# Patient Record
Sex: Female | Born: 1985 | Race: White | Hispanic: No | Marital: Married | Smoking: Current every day smoker
Health system: Southern US, Community
[De-identification: ages and names within clinical notes are randomized; demographics above are authoritative.]

## PROBLEM LIST (undated history)

## (undated) DIAGNOSIS — G43909 Migraine, unspecified, not intractable, without status migrainosus: Secondary | ICD-10-CM

## (undated) DIAGNOSIS — J45909 Unspecified asthma, uncomplicated: Secondary | ICD-10-CM

## (undated) DIAGNOSIS — O039 Complete or unspecified spontaneous abortion without complication: Secondary | ICD-10-CM

## (undated) DIAGNOSIS — F32A Depression, unspecified: Secondary | ICD-10-CM

## (undated) HISTORY — PX: TONSILLECTOMY: SUR1361

## (undated) HISTORY — PX: CYST REMOVAL TRUNK: SHX6283

---

## 2012-08-28 ENCOUNTER — Emergency Department: Payer: Self-pay | Admitting: Emergency Medicine

## 2013-03-03 ENCOUNTER — Emergency Department: Payer: Self-pay | Admitting: Unknown Physician Specialty

## 2013-03-03 LAB — URINALYSIS, COMPLETE
Bilirubin,UR: NEGATIVE
RBC,UR: 1 /HPF (ref 0–5)
WBC UR: 2 /HPF (ref 0–5)

## 2013-05-04 ENCOUNTER — Emergency Department: Payer: Self-pay | Admitting: Emergency Medicine

## 2013-05-08 ENCOUNTER — Encounter (HOSPITAL_COMMUNITY): Payer: Self-pay | Admitting: Emergency Medicine

## 2013-05-08 ENCOUNTER — Emergency Department (HOSPITAL_COMMUNITY): Payer: Self-pay

## 2013-05-08 ENCOUNTER — Emergency Department (HOSPITAL_COMMUNITY)
Admission: EM | Admit: 2013-05-08 | Discharge: 2013-05-08 | Disposition: A | Discharge status: Home / Self Care | Payer: Self-pay | Attending: Emergency Medicine | Admitting: Emergency Medicine

## 2013-05-08 DIAGNOSIS — J45909 Unspecified asthma, uncomplicated: Secondary | ICD-10-CM | POA: Insufficient documentation

## 2013-05-08 DIAGNOSIS — G43909 Migraine, unspecified, not intractable, without status migrainosus: Secondary | ICD-10-CM

## 2013-05-08 DIAGNOSIS — R209 Unspecified disturbances of skin sensation: Secondary | ICD-10-CM | POA: Insufficient documentation

## 2013-05-08 DIAGNOSIS — Z3202 Encounter for pregnancy test, result negative: Secondary | ICD-10-CM | POA: Insufficient documentation

## 2013-05-08 HISTORY — DX: Migraine, unspecified, not intractable, without status migrainosus: G43.909

## 2013-05-08 HISTORY — DX: Unspecified asthma, uncomplicated: J45.909

## 2013-05-08 LAB — RAPID URINE DRUG SCREEN, HOSP PERFORMED
Amphetamines: NOT DETECTED
Cocaine: NOT DETECTED
Opiates: NOT DETECTED

## 2013-05-08 LAB — APTT: aPTT: 27 seconds (ref 24–37)

## 2013-05-08 LAB — DIFFERENTIAL
Basophils Absolute: 0 10*3/uL (ref 0.0–0.1)
Basophils Relative: 0 % (ref 0–1)
Eosinophils Absolute: 0.2 10*3/uL (ref 0.0–0.7)
Monocytes Relative: 7 % (ref 3–12)
Neutro Abs: 5.2 10*3/uL (ref 1.7–7.7)
Neutrophils Relative %: 54 % (ref 43–77)

## 2013-05-08 LAB — TROPONIN I: Troponin I: <0.3 ng/mL (ref <0.30)

## 2013-05-08 LAB — CBC
Hemoglobin: 13.5 g/dL (ref 12.0–15.0)
MCH: 29.3 pg (ref 26.0–34.0)
MCHC: 34.7 g/dL (ref 30.0–36.0)
Platelets: 208 10*3/uL (ref 150–400)
RDW: 13.3 % (ref 11.5–15.5)

## 2013-05-08 LAB — PROTIME-INR
INR: 0.9 (ref 0.00–1.49)
Prothrombin Time: 12.1 seconds (ref 11.6–15.2)

## 2013-05-08 LAB — COMPREHENSIVE METABOLIC PANEL
AST: 19 U/L (ref 0–37)
Albumin: 4.1 g/dL (ref 3.5–5.2)
Alkaline Phosphatase: 75 U/L (ref 39–117)
BUN: 19 mg/dL (ref 6–23)
Chloride: 102 mEq/L (ref 96–112)
Potassium: 3.2 mEq/L — ABNORMAL LOW (ref 3.5–5.1)
Total Protein: 7.2 g/dL (ref 6.0–8.3)

## 2013-05-08 LAB — POCT PREGNANCY, URINE: Preg Test, Ur: NEGATIVE

## 2013-05-08 LAB — POCT I-STAT TROPONIN I: Troponin i, poc: 0.01 ng/mL (ref 0.00–0.08)

## 2013-05-08 LAB — GLUCOSE, CAPILLARY: Glucose-Capillary: 106 mg/dL — ABNORMAL HIGH (ref 70–99)

## 2013-05-08 MED ORDER — ONDANSETRON 4 MG PO TBDP: 8.0000 mg | ORAL_TABLET | Freq: Once | ORAL | Status: DC

## 2013-05-08 MED ORDER — DEXAMETHASONE SODIUM PHOSPHATE 10 MG/ML IJ SOLN
10.0000 mg | Freq: Once | INTRAMUSCULAR | Status: AC
  Administered 2013-05-08: 10 mg via INTRAVENOUS
  Filled 2013-05-08: qty 1

## 2013-05-08 MED ORDER — PROCHLORPERAZINE EDISYLATE 5 MG/ML IJ SOLN
10.0000 mg | Freq: Once | INTRAMUSCULAR | Status: AC
  Administered 2013-05-08: 10 mg via INTRAVENOUS
  Filled 2013-05-08: qty 2

## 2013-05-08 MED ORDER — METOCLOPRAMIDE HCL 5 MG/ML IJ SOLN
10.0000 mg | Freq: Once | INTRAMUSCULAR | Status: AC
  Administered 2013-05-08: 10 mg via INTRAVENOUS
  Filled 2013-05-08 (×2): qty 2

## 2013-05-08 MED ORDER — KETOROLAC TROMETHAMINE 15 MG/ML IJ SOLN
15.0000 mg | Freq: Once | INTRAMUSCULAR | Status: AC
  Administered 2013-05-08: 15 mg via INTRAVENOUS
  Filled 2013-05-08: qty 1

## 2013-05-08 MED ORDER — POTASSIUM CHLORIDE CRYS ER 20 MEQ PO TBCR
40.0000 meq | EXTENDED_RELEASE_TABLET | Freq: Once | ORAL | Status: AC
  Administered 2013-05-08: 40 meq via ORAL
  Filled 2013-05-08: qty 2

## 2013-05-08 MED ORDER — LORAZEPAM 1 MG PO TABS
1.0000 mg | ORAL_TABLET | Freq: Once | ORAL | Status: AC
  Administered 2013-05-08: 1 mg via ORAL
  Filled 2013-05-08: qty 1

## 2013-05-08 NOTE — ED Notes (Signed)
Pt vomited filling half of an emesis bag. MD informed.

## 2013-05-08 NOTE — ED Provider Notes (Signed)
History     CSN: [id number]  Arrival date & time 05/08/13  1224   First MD Initiated Contact with Patient 05/08/13 1232      Chief Complaint  Patient presents with  . Code Stroke     Patient is a 27 y.o. female presenting with migraines.  Migraine This is a new problem. The current episode started today. The problem occurs constantly. The problem has been unchanged. Associated symptoms include headaches and numbness (Numbness started on the rigth side of her face and moved to the left side of her face.  she also has numbness in her bilateral feet). Pertinent negatives include no abdominal pain, chest pain, chills, congestion, coughing, diaphoresis, fever, nausea, neck pain, rash, sore throat, vertigo, visual change, vomiting or weakness. Nothing aggravates the symptoms. She has tried nothing for the symptoms.   She states that she was first diagnosed with migraines last week.  Has been having frequent, similar headaches for some time now, usually unilateral.  Headache was gradual in onset.  Associated with significant anxiety.  Patient noted to be hyperventilating and very anxious appearing at presentation.  She states she has been awake for 36 hours straight.  Currently with pain only to her head and left face; denies chest pain, neck pain, neck stiffness, fever, chills, abdominal pain, nausea, vomiting, vaginal bleeding, leg pain, or other significant symptoms.  Only partially cooperative with neuro exam by stroke team; states she just wants to call her wife.   Past Medical History  Diagnosis Date  . Migraines   . Asthma     History reviewed. No pertinent past surgical history.  No family history on file.  History  Substance Use Topics  . Smoking status: Not on file  . Smokeless tobacco: Not on file  . Alcohol Use: Not on file    OB History   Grav Para Term Preterm Abortions TAB SAB Ect Mult Living                  Review of Systems  Constitutional: Negative for fever,  chills and diaphoresis.  HENT: Negative for congestion, sore throat, rhinorrhea, neck pain and neck stiffness.   Respiratory: Negative for cough, shortness of breath and wheezing.   Cardiovascular: Negative for chest pain and leg swelling.  Gastrointestinal: Negative for nausea, vomiting, abdominal pain and diarrhea.  Genitourinary: Negative for dysuria, urgency, frequency, flank pain, vaginal bleeding, vaginal discharge and difficulty urinating.  Skin: Negative for rash.  Neurological: Positive for numbness (Numbness started on the rigth side of her face and moved to the left side of her face.  she also has numbness in her bilateral feet) and headaches. Negative for vertigo and weakness.  All other systems reviewed and are negative.    Allergies  Other  Home Medications   Current Outpatient Rx  Name  Route  Sig  Dispense  Refill  . albuterol (PROVENTIL HFA;VENTOLIN HFA) 108 (90 BASE) MCG/ACT inhaler   Inhalation   Inhale 2 puffs into the lungs every 6 (six) hours as needed for wheezing.           BP 104/67  Pulse 101  Temp(Src) 98 F (36.7 C)  Resp 18  SpO2 98%  LMP 04/17/2013  Physical Exam  Nursing note and vitals reviewed. Constitutional: She is oriented to person, place, and time. She appears well-developed and well-nourished. No distress.  Anxious, hyperventilating, semi-cooperative with neuro exam, alert, oriented X 4  HENT:  Head: Normocephalic and atraumatic.  Mouth/Throat: Oropharynx  is clear and moist.  Eyes: Conjunctivae and EOM are normal. Pupils are equal, round, and reactive to light. No scleral icterus.  Neck: Normal range of motion. Neck supple. No JVD present.  Cardiovascular: Normal rate, regular rhythm, normal heart sounds and intact distal pulses.  Exam reveals no gallop and no friction rub.   No murmur heard. Pulmonary/Chest: Effort normal and breath sounds normal. No respiratory distress. She has no wheezes. She has no rales.  Abdominal: Soft.  Bowel sounds are normal. She exhibits no distension. There is no tenderness. There is no rebound and no guarding.  Musculoskeletal: She exhibits no edema.  Neurological: She is alert and oriented to person, place, and time. No cranial nerve deficit or sensory deficit. She exhibits normal muscle tone. She displays no seizure activity. Coordination normal. GCS eye subscore is 4. GCS verbal subscore is 5. GCS motor subscore is 6. She displays no Babinski's sign on the right side. She displays no Babinski's sign on the left side.  Normal finger to nose and heel to shin.  Visual fields intact to confrontation.    Skin: Skin is warm and dry. She is not diaphoretic.    ED Course  Procedures (including critical care time)  Labs Reviewed  COMPREHENSIVE METABOLIC PANEL - Abnormal; Notable for the following:    Potassium 3.2 (*)    Glucose, Bld 110 (*)    All other components within normal limits  GLUCOSE, CAPILLARY - Abnormal; Notable for the following:    Glucose-Capillary 106 (*)    All other components within normal limits  URINE RAPID DRUG SCREEN (HOSP PERFORMED) - Abnormal; Notable for the following:    Tetrahydrocannabinol POSITIVE (*)    Barbiturates POSITIVE (*)    All other components within normal limits  PROTIME-INR  APTT  CBC  DIFFERENTIAL  TROPONIN I  POCT I-STAT TROPONIN I  POCT PREGNANCY, URINE   Dg Chest 2 View  05/08/2013   *RADIOLOGY REPORT*  Clinical Data: Shortness of breath, anxiety, history migraines, asthma  CHEST - 2 VIEW  Comparison: None  Findings: Upper normal heart size. Mediastinal contours and pulmonary vascularity normal. Low lung volumes with bibasilar atelectasis. No definite infiltrate, pleural effusion or pneumothorax. No acute osseous findings.  IMPRESSION: Bibasilar atelectasis.   Original Report Authenticated By: Ulyses Southward, M.D.   Ct Head (brain) Wo Contrast  05/08/2013   *RADIOLOGY REPORT*  Clinical Data: Code stroke.  Facial numbness.  Right side of  body numbness.  CT HEAD WITHOUT CONTRAST  Technique:  Contiguous axial images were obtained from the base of the skull through the vertex without contrast.  Comparison: None  Findings: The brain has a normal appearance without evidence for hemorrhage, infarction, hydrocephalus, or mass lesion.  There is no extra axial fluid collection.  The skull and paranasal sinuses are normal.  IMPRESSION: Normal exam.   Original Report Authenticated By: Signa Kell, M.D.     1. Migraine       MDM  27 yo F with h/o migraines presents as code stroke due to EMS c/f slurred speech.  She c/o unilateral headache and face numbness.  No other neuro complaints.  Normal vitals.  Very anxious appearing.  Hyperventilating.  Non-focal neuro exam.  No meningismus.  Exam otherwise unremarkable.    CT negative.  Evaluated and cleared by neurology.  Doubt SAH, CNS infection, cerebral venous sinus thrombosis, or other emergent cause.  Likely complex migraine.  Treated with migraine cocktail and ativan.  Complete resolution of sx.  Discharged home with return precautions.        Toney Sang, MD 05/08/13 2153

## 2013-05-08 NOTE — Code Documentation (Signed)
27 year old female who presented to [facility] as code stroke.  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 EMS was called and code stroke was called from the field at 1213 with ETA 10 mins.  Patient arrived to ED at 1224.  EDP exam at 1224.  Stroke team arrival at 1224.  To CT scan at 1226. Witnessed sx started at 1135 so LSW 1135.  Patient inconsistent with exam - became more inconsistent as she became more anxious with time.  She first stated left side numbness c/o then with increased anxiety stated it was entire body.  She does endorse headache with some photophobia.  States she was diagnosed last week with migraines but no meds prescribed.  No focal deficit noted.  NIHSS 0.  ST when crying with anxiety - calling for [place] to get her family but cannot recall last names or phone numbers.  When family arrives she calms with their presence and medications for headache and nausea and anxiety.  Code stroke cancelled per Dr. Cyril Mourning.  Suspect migraine.  Handoff to [facility] RN and Musician - to call if sx return.

## 2013-05-08 NOTE — Discharge Instructions (Signed)
Migraine Headache A migraine headache is very bad, throbbing pain on one or both sides of your head. Talk to your doctor about what things may bring on (trigger) your migraine headaches. HOME CARE  Only take medicines as told by your doctor.  Lie down in a dark, quiet room when you have a migraine.  Keep a journal to find out if certain things bring on migraine headaches. For example, write down:  What you eat and drink.  How much sleep you get.  Any change to your diet or medicines.  Lessen how much alcohol you drink.  Quit smoking if you smoke.  Get enough sleep.  Lessen any stress in your life.  Keep lights dim if bright lights bother you or make your migraines worse. GET HELP RIGHT AWAY IF:   Your migraine becomes really bad.  You have a fever.  You have a stiff neck.  You have trouble seeing.  Your muscles are weak, or you lose muscle control.  You lose your balance or have trouble walking.  You feel like you will pass out (faint), or you pass out.  You have really bad symptoms that are different than your first symptoms. MAKE SURE YOU:   Understand these instructions.  Will watch your condition.  Will get help right away if you are not doing well or get worse. Document Released: 09/12/2008 Document Revised: 02/26/2012 Document Reviewed: 11/24/2011 [facility] Patient Information 2014 [place], [place].   RESOURCE GUIDE  Chronic Pain Problems: Contact [facility]  [phone] Patients need to be referred by their primary care doctor.  Insufficient Money for Medicine: Contact United Way:  call "211."   No Primary Care Doctor: - Call Health Connect  [phone] - can help you locate a primary care doctor that  accepts your insurance, provides certain services, etc. - Physician Referral Service- [phone]  Agencies that provide inexpensive medical care: - [facility]  [phone] - [facility]   [phone] - [facility]  [phone] - Women's Clinic  [phone] - Planned Parenthood  [phone] [facility]  [phone]  Medicaid-accepting [facility] Providers: - [facility]- [address]  [phone], Mon-Fri 9am-7pm, Sat 9am-1pm - [facility]- [address] [place], Suite [place]  [phone] - [facility]- [address], [address] [place]  [phone] [facility]- [address]  [phone] - Renaye Rakers- [address] [place], [address], [phone]  Only accepts [place] patients after they have their name  applied to their card  Self Pay (no insurance) in [place]: - Sickle Cell Patients - [facility]  [address] [place], [phone] - [facility]- [address] [place]  [phone]       [facility] [place]- [address]       -     [facility]- see information above (Speak to [employer] if you do not have insurance)       -  [facility]- [address],  [phone]       -  [facility]- [address], [phone]       -  Dr Julio Sicks-  [address], [place], [phone]       -  [facility] - [address], [phone]       -  [facility]- [address], [phone], also [address] [phone]       -     [facility]- [address] [place], [phone], 1st & 3rd Saturday         every month, 10am-1pm  -     [facility]   [address] [place], [place].   Phone:  [phone], Fax:  [phone]. Hours of Operation:  9 am - 6 pm, M-F.  -     [facility]   [address], [place]   Phone: [phone], Fax: [phone]. Hours of Operation:  8:30 am - 5:30 pm, M-F.  [facility] [address] [place], [place]  [address] [phone]  [facility] [address] [place], [place] [address] [phone]  1) Find a Doctor and Pay Out of Pocket Although you won't have to find out who is covered by your insurance plan, it is a good idea to ask around and get recommendations. You will then need to call the office and see if the doctor you have chosen will accept you as a new patient and what types of options they offer for patients who are self-pay. Some doctors offer discounts or will set up payment plans for their patients who do not have insurance, but you will need to ask so you aren't surprised when you get to your appointment.  2) Contact Your Local Health Department Not all health departments have doctors that can see patients for sick visits, but many do, so it is worth a call to see if yours does. If you don't know where your local health department is, you can check in your phone book. The CDC also has a tool to help you locate your state's health department, and many state websites also have listings of all of their local health departments.  3) Find a Walk-in Clinic If your illness is not likely to be very severe or complicated, you may want to try a walk in clinic. These are popping up all over the country in pharmacies, drugstores, and shopping centers. They're usually staffed by nurse practitioners or physician assistants that have been trained to treat common illnesses and complaints. They're usually fairly quick and inexpensive. However, if you have serious medical issues or chronic medical problems, these are probably not your best option  STD Testing - [facility] Department of [facility] - [facility], STD Clinic, [address] [place], phone [phone] or [phone].  Monday - Friday, call for an appointment. [facility] of [employer], STD Clinic, [address], [place], phone [phone] or [phone].  Monday - Friday, call for an  appointment.  Abuse/Neglect: [facility] Child Abuse Hotline 806-647-2488 [facility] Child Abuse Hotline 440-591-4198 (After Hours)  Emergency Shelter:  [facility] [phone]  Maternity Homes: - Room at the [place] [facility] [phone] - [facility] [phone]  MRSA Hotline #:   (463) 688-4223  Dental Assistance If unable to pay or uninsured, contact:  [facility]. to become qualified for the adult dental clinic.  Patients with Medicaid: [facility] [phone], [phone] [address] [phone]  If unable to pay, or uninsured, contact [facility] [phone] in [place], [phone]  in [facility]) to become qualified for the adult dental clinic  [facility] [address] [place], [place] [address] [phone] [email or web address]  Other Proofreader Services: - [facility]- [address] [place], [place], [place], [address], [phone], Ext. 123, 2nd and 4th Thursday of the month at 6:30am.  10 clients each day by appointment, can sometimes see walk-in patients if someone does not show for an appointment. [facility]- [address] [place], [place], [address], [phone] - [facility] [address], [place], [place], [address], [phone] - [facility]- [phone] [facility] Health Department- [phone] [facility] Health [phone]       Behavioral Health Resources in the [facility]  Intensive Outpatient Programs: [facility]      [address] [place], [place] [phone] Both a day and evening program       [facility] Outpatient     [address]        [place], [place] [address] [phone]         ADS: Alcohol & Drug Svcs [address] [place] [phone]  [facility] Mental  Health ACCESS LINE: 705-567-4429 or [phone] [address] [place], [place] [address] [email or web address]   Substance Abuse Resources: - Alcohol and Drug Services  (757)723-7968 - [facility] [phone] - [facility] [place] [phone] Floydene Flock [phone] - Residential & Outpatient Substance Abuse Program  ([phone])[phone]  Psychological Services: [facility]  [phone] [facility]  [phone] - [facility], [phone] [place]. [address] [place], ACCESS LINE: (573)815-5466 or [phone], [email or web address]  Mobile Crisis Teams:                                        Therapeutic Alternatives         Mobile Crisis Care Unit [phone]             [facility] [address] Ginette Otto [phone]                                         Interventionist [address] [place] [phone]  Self-Help/Support Groups: Mental Health Assoc. of [employer] of support groups 440-403-2733 (call for more info)  Narcotics Anonymous (NA) Caring Services [address] [place] - 2 meetings at this location  Residential Treatment Programs:  [facility]      [address]        [place]       [phone]         [facility] [address] [place] [address]378 [place], [place]  [address] [phone]  [facility] Treatment Facility  [address] [place], [place] [address] [phone] Admissions: 8am-3pm M-F  [facility]     [address]        [place], [place] [address]       [phone]         [facility] [address] [place]ling Manns [place], [place] [phone]  [facility] [address] [place], [place] [phone]  [facility] - Intensive Outpatient      [address]     [place], [place]       [phone]         [facility] (Addiction Recovery [facility]     [address] [place], [place] [phone] or [phone]  Residential Treatment Services (RTS), Medicaid [address] [place], [place] 629-528-4132  Fellowship [address]                                               [address] [place] [phone]  [facility] Resources: [phone]               General Therapy                                                Angie Fava, PhD        [address] [place], [place] [address]         [phone]   Insurance  [facility] Behavioral   [address] [place], [place] [address] [phone]  [facility] [address] [place], [place] [address] [phone] Insurance/Medicaid/sponsorship through [facility] and Families                                              [address]                                        [place], [place] [address]    Therapy/tele-psych/case         [phone]          [facility] [address], [place]  [address]  Adolescent/group home/case management [phone]                                           Creola Corn PhD       General therapy       Insurance   [phone]         Dr. Lolly Mustache, [place], M-F [phone]  [facility] [place]  [employer] [facility] Dept. [address]                 [address]         [address] [place] [address]  [address]  [facility] Phone:  [phone]                                  Phone:  [phone]                   Phone:  [phone]  [facility], [phone] - [facility] - [facility]- [phone]       -     [facility] in [place], [address]              [phone], [facility] Child Abuse Hotline 920-309-3511 or 531-296-5980 (After Hours)

## 2013-05-08 NOTE — ED Notes (Signed)
Per EMS pt states she started experiencing numbness and tingling on the left side of her face. Per EMS pt states the numbness and tingling started on the right side and moved to the left side.

## 2013-05-08 NOTE — Consult Note (Signed)
Referring Physician: Fredderick Phenix    Chief Complaint: left facial numbness and facial droop  HPI:                                                                                                                                         Diane Powell is an 27 y.o. female who was diagnosed with migraine HA one week ago but takes no abortive medications at this time.  The only other PMHx is Asthma. Patient was at work today when she noted a HA with associated symptoms of left facial numbness and photophobia. Per patient her boss also notice a left facial droop.  Patient initially stated she only had left facial numbness but later stated it was left face, arm and leg.  The history is inconstant, as further into her examination she stated it was actually bilateral arms and legs "whole body numbness".  She was brought to [facility] as a code stroke.  On initial exam patient showed no focal signs but did have subjective tingling through out her body. She is very anxious during exam. When asked if she has history of anxiety she denies this. She would state she felt short of breath during exam but O2 saturation was 100%.  Patient continued to ask for her wife and had a difficult time concentrating on answering questions.   Date last known well: 5.2214 Time last known well: 1200 tPA Given: No: resolution of symptoms  Past Medical History  Diagnosis Date  . Migraines   . Asthma     History reviewed. No pertinent past surgical history.  No family history on file. Social History:  has no tobacco, alcohol, and drug history on file.  Allergies: Allergies not on file  Medications:                                                                                                                           No current facility-administered medications for this encounter.   No current outpatient prescriptions on file.    ROS:  History obtained from the patient  General ROS: negative for - chills, fatigue, fever, night sweats, weight gain or weight loss Psychological ROS: negative for - behavioral disorder, hallucinations, memory difficulties, mood swings or suicidal ideation Ophthalmic ROS: negative for - blurry vision, double vision, eye pain or loss of vision ENT ROS: negative for - epistaxis, nasal discharge, oral lesions, sore throat, tinnitus or vertigo Allergy and Immunology ROS: negative for - hives or itchy/watery eyes Hematological and Lymphatic ROS: negative for - bleeding problems, bruising or swollen lymph nodes Endocrine ROS: negative for - galactorrhea, hair pattern changes, polydipsia/polyuria or temperature intolerance Respiratory ROS: negative for - cough, hemoptysis, shortness of breath or wheezing Cardiovascular ROS: negative for - chest pain, dyspnea on exertion, edema or irregular heartbeat Gastrointestinal ROS: negative for - abdominal pain, diarrhea, hematemesis, nausea/vomiting or stool incontinence Genito-Urinary ROS: negative for - dysuria, hematuria, incontinence or urinary frequency/urgency Musculoskeletal ROS: negative for - joint swelling or muscular weakness Neurological ROS: as noted in HPI Dermatological ROS: negative for rash and skin lesion changes  Neurologic Examination:                                                                                                      Blood pressure 127/68, pulse 105, temperature 98 F (36.7 C), resp. rate 18, last menstrual period 04/17/2013, SpO2 100.00%.   Mental Status: Alert, oriented, very anxious.  Not consist ant when answering questions due to anxiety.  Speech fluent without evidence of aphasia.  Able to follow 3 step commands without difficulty. Cranial Nerves: II: Discs flat bilaterally; Visual fields grossly normal, pupils equal, round, reactive to light and accommodation III,IV, VI: ptosis  not present, extra-ocular motions intact bilaterally V,VII: smile symmetric, facial light touch sensation normal bilaterally VIII: hearing normal bilaterally IX,X: gag reflex present XI: bilateral shoulder shrug XII: midline tongue extension Motor: Right : Upper extremity   5/5    Left:     Upper extremity   5/5  Lower extremity   5/5     Lower extremity   5/5 Tone and bulk:normal tone throughout; no atrophy noted Sensory: Pinprick and light touch intact throughout, bilaterally Deep Tendon Reflexes: 2+ and symmetric throughout Plantars: Right: downgoing   Left: downgoing Cerebellar: normal finger-to-nose,  normal heel-to-shin test CV: pulses palpable throughout    Results for orders placed during the hospital encounter of 05/08/13 (from the past 48 hour(s))  PROTIME-INR     Status: None   Collection Time    05/08/13 12:40 PM      Result Value Range   Prothrombin Time 12.1  11.6 - 15.2 seconds   INR 0.90  0.00 - 1.49  APTT     Status: None   Collection Time    05/08/13 12:40 PM      Result Value Range   aPTT 27  24 - 37 seconds  CBC     Status: None   Collection Time    05/08/13 12:40 PM      Result Value Range   WBC 9.7  4.0 - 10.5 K/uL  RBC 4.60  3.87 - 5.11 MIL/uL   Hemoglobin 13.5  12.0 - 15.0 g/dL   HCT 16.1  09.6 - 04.5 %   MCV 84.6  78.0 - 100.0 fL   MCH 29.3  26.0 - 34.0 pg   MCHC 34.7  30.0 - 36.0 g/dL   RDW 40.9  81.1 - 91.4 %   Platelets 208  150 - 400 K/uL  DIFFERENTIAL     Status: None   Collection Time    05/08/13 12:40 PM      Result Value Range   Neutrophils Relative % 54  43 - 77 %   Neutro Abs 5.2  1.7 - 7.7 K/uL   Lymphocytes Relative 37  12 - 46 %   Lymphs Abs 3.5  0.7 - 4.0 K/uL   Monocytes Relative 7  3 - 12 %   Monocytes Absolute 0.7  0.1 - 1.0 K/uL   Eosinophils Relative 2  0 - 5 %   Eosinophils Absolute 0.2  0.0 - 0.7 K/uL   Basophils Relative 0  0 - 1 %   Basophils Absolute 0.0  0.0 - 0.1 K/uL  COMPREHENSIVE METABOLIC PANEL      Status: Abnormal (Preliminary result)   Collection Time    05/08/13 12:40 PM      Result Value Range   Sodium 138  135 - 145 mEq/L   Potassium PENDING  3.5 - 5.1 mEq/L   Chloride 102  96 - 112 mEq/L   CO2 22  19 - 32 mEq/L   Glucose, Bld 110 (*) 70 - 99 mg/dL   BUN 19  6 - 23 mg/dL   Creatinine, Ser 7.82  0.50 - 1.10 mg/dL   Calcium 9.5  8.4 - 95.6 mg/dL   Total Protein 7.2  6.0 - 8.3 g/dL   Albumin 4.1  3.5 - 5.2 g/dL   AST 19  0 - 37 U/L   ALT 25  0 - 35 U/L   Alkaline Phosphatase 75  39 - 117 U/L   Total Bilirubin 0.3  0.3 - 1.2 mg/dL   GFR calc non Af Amer >90  >90 mL/min   GFR calc Af Amer >90  >90 mL/min   Comment:            The eGFR has been calculated     using the CKD EPI equation.     This calculation has not been     validated in all clinical     situations.     eGFR's persistently     <90 mL/min signify     possible Chronic Kidney Disease.  POCT I-STAT TROPONIN I     Status: None   Collection Time    05/08/13 12:43 PM      Result Value Range   Troponin i, poc 0.01  0.00 - 0.08 ng/mL   Comment 3            Comment: Due to the release kinetics of cTnI,     a negative result within the first hours     of the onset of symptoms does not rule out     myocardial infarction with certainty.     If myocardial infarction is still suspected,     repeat the test at appropriate intervals.  GLUCOSE, CAPILLARY     Status: Abnormal   Collection Time    05/08/13 12:54 PM      Result Value Range   Glucose-Capillary 106 (*) 70 -  99 mg/dL   Ct Head (brain) Wo Contrast  05/08/2013   *RADIOLOGY REPORT*  Clinical Data: Code stroke.  Facial numbness.  Right side of body numbness.  CT HEAD WITHOUT CONTRAST  Technique:  Contiguous axial images were obtained from the base of the skull through the vertex without contrast.  Comparison: None  Findings: The brain has a normal appearance without evidence for hemorrhage, infarction, hydrocephalus, or mass lesion.  There is no extra axial  fluid collection.  The skull and paranasal sinuses are normal.  IMPRESSION: Normal exam.   Original Report Authenticated By: Signa Kell, M.D.    Assessment and plan discussed with with attending physician and they are in agreement.    Felicie Morn PA-C [facility] [phone]  05/08/2013, 1:38 PM   Assessment: 27 y.o. female with new diagnosis of migraine HA (one week ago) presenting to hospital with HA, photophobia and left facial/body numbness. During exam patient showed no localizing or lateralizing symptoms and was very anxious.  Patient symptoms likely secondary to migraine HA with neurological symptoms versus anxiety. Do not believe this is TIA or stroke.   Stroke Risk Factors - smoking  Plan: 1) pain control for HA 2) follow up with out patient PCP for HA and anxiety   Patient seen and examined together with physician assistant and I concur with the assessment and plan.  Wyatt Portela, MD

## 2013-05-08 NOTE — ED Notes (Signed)
Pt vomited filling 1.5 emesis bags. MD informed. See orders.

## 2013-05-10 NOTE — ED Provider Notes (Signed)
I saw and evaluated the patient, reviewed the resident's note and I agree with the findings and plan.   Dantre Yearwood, MD 05/10/13 0703 

## 2015-02-03 ENCOUNTER — Emergency Department: Payer: Self-pay | Admitting: Emergency Medicine

## 2015-09-17 ENCOUNTER — Ambulatory Visit (INDEPENDENT_AMBULATORY_CARE_PROVIDER_SITE_OTHER): Payer: Self-pay | Admitting: Family Medicine

## 2015-09-17 ENCOUNTER — Encounter: Payer: Self-pay | Admitting: Family Medicine

## 2015-09-17 VITALS — BP 122/78 | HR 82 | Temp 98.0°F | Resp 16 | Ht 65.0 in | Wt 195.0 lb

## 2015-09-17 DIAGNOSIS — J4540 Moderate persistent asthma, uncomplicated: Secondary | ICD-10-CM

## 2015-09-17 DIAGNOSIS — N979 Female infertility, unspecified: Secondary | ICD-10-CM

## 2015-09-17 MED ORDER — ALBUTEROL SULFATE HFA 108 (90 BASE) MCG/ACT IN AERS: 2.0000 | INHALATION_SPRAY | Freq: Four times a day (QID) | RESPIRATORY_TRACT | Status: DC | PRN

## 2015-09-17 MED ORDER — BECLOMETHASONE DIPROPIONATE 40 MCG/ACT IN AERS: 2.0000 | INHALATION_SPRAY | Freq: Two times a day (BID) | RESPIRATORY_TRACT | Status: DC

## 2015-09-17 NOTE — Patient Instructions (Addendum)
Discussed stopping smoking before getting pregnant.

## 2015-09-17 NOTE — Progress Notes (Signed)
Name: Diane Powell   MRN: [id number]    DOB: [birth year]   Date:09/17/2015       Progress Note  Subjective  Chief Complaint  Chief Complaint  Patient presents with  . fertility    Pt has concerns.    HPI Here concerned about fertility. Has hx. Of endometriosis.  "Trying to get pregnant for 2 months."  She was pregnant in 2008.  Had a spont. Ab at 15 weeks.  Has a different partner now.  He has fathered children in past.  She is an every day smoker.    No problem-specific assessment & plan notes found for this encounter.   Past Medical History  Diagnosis Date  . Migraines   . Asthma     Social History  Substance Use Topics  . Smoking status: Current Every Day Smoker -- 0.50 packs/day for 13 years    Types: Cigarettes  . Smokeless tobacco: Not on file  . Alcohol Use: No     Current outpatient prescriptions:  .  albuterol (PROVENTIL HFA;VENTOLIN HFA) 108 (90 BASE) MCG/ACT inhaler, Inhale 2 puffs into the lungs every 6 (six) hours as needed for wheezing., Disp: , Rfl:   Allergies  Allergen Reactions  . Other     Pt states she has an allergy to a medication that starts with the letter Z. Pt states it was given IV and that she was very hot and itchy.     Review of Systems  Constitutional: Negative for fever, chills, weight loss and malaise/fatigue.  HENT: Negative for hearing loss.   Eyes: Negative for blurred vision and double vision.  Respiratory: Positive for cough, sputum production (clear), shortness of breath and wheezing.   Cardiovascular: Negative for chest pain, palpitations, orthopnea and leg swelling.  Gastrointestinal: Positive for heartburn. Negative for abdominal pain and blood in stool.  Genitourinary: Negative for dysuria, urgency and frequency.  Neurological: Negative for dizziness, sensory change, focal weakness, weakness and headaches.      Objective  Filed Vitals:   09/17/15 0839  BP: 122/78  Pulse: 82  Temp: 98 F (36.7 C)  TempSrc: Oral   Resp: 16  Height: 5\' 5"  (1.651 m)  Weight: 195 lb (88.451 kg)     Physical Exam  Constitutional: She is oriented to person, place, and time and well-developed, well-nourished, and in no distress. No distress.  HENT:  Head: Normocephalic and atraumatic.  Neck: Normal range of motion. Neck supple. No thyromegaly present.  Cardiovascular: Normal rate, regular rhythm, normal heart sounds and intact distal pulses.  Exam reveals no gallop and no friction rub.   No murmur heard. Pulmonary/Chest: Effort normal and breath sounds normal. No respiratory distress. She has no wheezes. She has no rales.  Abdominal: Soft. Bowel sounds are normal. She exhibits no distension. There is no tenderness. There is no rebound.  Musculoskeletal: Normal range of motion. She exhibits edema.  Lymphadenopathy:    She has no cervical adenopathy.  Neurological: She is alert and oriented to person, place, and time.  Vitals reviewed.     No results found for this or any previous visit (from the past 2160 hour(s)).   Assessment & Plan 1. Infertility, female  - Comprehensive Metabolic Panel (CMET) - CBC with Differential - TSH  2. Asthma, moderate persistent, uncomplicated  - albuterol (PROVENTIL HFA;VENTOLIN HFA) 108 (90 BASE) MCG/ACT inhaler; Inhale 2 puffs into the lungs every 6 (six) hours as needed for wheezing.  Dispense: 1 Inhaler; Refill: 12 - beclomethasone (QVAR) 40  MCG/ACT inhaler; Inhale 2 puffs into the lungs 2 (two) times daily. For asthma  Dispense: 1 Inhaler; Refill: 12

## 2017-09-18 ENCOUNTER — Emergency Department
Admission: EM | Admit: 2017-09-18 | Discharge: 2017-09-18 | Disposition: A | Discharge status: Home / Self Care | Payer: Self-pay | Attending: Emergency Medicine | Admitting: Emergency Medicine

## 2017-09-18 ENCOUNTER — Emergency Department: Payer: Self-pay

## 2017-09-18 ENCOUNTER — Encounter: Payer: Self-pay | Admitting: Medical Oncology

## 2017-09-18 DIAGNOSIS — Y939 Activity, unspecified: Secondary | ICD-10-CM | POA: Insufficient documentation

## 2017-09-18 DIAGNOSIS — Y999 Unspecified external cause status: Secondary | ICD-10-CM | POA: Insufficient documentation

## 2017-09-18 DIAGNOSIS — X500XXA Overexertion from strenuous movement or load, initial encounter: Secondary | ICD-10-CM | POA: Insufficient documentation

## 2017-09-18 DIAGNOSIS — S93402A Sprain of unspecified ligament of left ankle, initial encounter: Secondary | ICD-10-CM | POA: Insufficient documentation

## 2017-09-18 DIAGNOSIS — Y929 Unspecified place or not applicable: Secondary | ICD-10-CM | POA: Insufficient documentation

## 2017-09-18 MED ORDER — NAPROXEN 500 MG PO TABS: 500.0000 mg | ORAL_TABLET | Freq: Two times a day (BID) | ORAL | 0 refills | Status: DC

## 2017-09-18 MED ORDER — TRAMADOL HCL 50 MG PO TABS: 50.0000 mg | ORAL_TABLET | Freq: Four times a day (QID) | ORAL | 0 refills | Status: DC | PRN

## 2017-09-18 NOTE — ED Provider Notes (Signed)
[facility] Emergency Department Provider Note   ____________________________________________   First MD Initiated Contact with Patient 09/18/17 [phone]     (approximate)  I have reviewed the triage vital signs and the nursing notes.   HISTORY  Chief Complaint Ankle Pain    HPI Diane Powell is a 31 y.o. female patient complaining of left lateral ankle pain secondary to a twisting incident yesterday. Patient states she missed a step similar trailer yesterday. Patient rates the pain as a 6/10.Patient describes the pain as "achy". Patient stated pain increases with ambulation. No palliative measure prior to arrival. Ice pack was applied and patient into the exam room.   History reviewed. No pertinent past medical history.  There are no active problems to display for this patient.   No past surgical history on file.  Prior to Admission medications   Medication Sig Start Date End Date Taking? Authorizing Provider  naproxen (NAPROSYN) 500 MG tablet Take 1 tablet (500 mg total) by mouth 2 (two) times daily with a meal. 09/18/17   Sable Feil, PA-C  traMADol (ULTRAM) 50 MG tablet Take 1 tablet (50 mg total) by mouth every 6 (six) hours as needed for moderate pain. 09/18/17   Sable Feil, PA-C    Allergies Patient has no known allergies.  No family history on file.  Social History Social History  Substance Use Topics  . Smoking status: Not on file  . Smokeless tobacco: Not on file  . Alcohol use Not on file    Review of Systems Constitutional: No fever/chills Eyes: No visual changes. ENT: No sore throat. Cardiovascular: Denies chest pain. Respiratory: Denies shortness of breath. Gastrointestinal: No abdominal pain.  No nausea, no vomiting.  No diarrhea.  No constipation. Genitourinary: Negative for dysuria. Musculoskeletal: Left ankle pain  Skin: Negative for rash. Neurological: Negative for headaches, focal weakness or  numbness.   ____________________________________________   PHYSICAL EXAM:  VITAL SIGNS: ED Triage Vitals  Enc Vitals Group     BP 09/18/17 0743 122/78     Pulse Rate 09/18/17 0743 93     Resp 09/18/17 0743 16     Temp 09/18/17 0743 98.2 F (36.8 C)     Temp Source 09/18/17 0743 Oral     SpO2 09/18/17 0743 98 %     Weight --      Height --      Head Circumference --      Peak Flow --      Pain Score 09/18/17 0739 6     Pain Loc --      Pain Edu? --      Excl. in [place]? --     Constitutional: Alert and oriented. Well appearing and in no acute distress. Cardiovascular: Normal rate, regular rhythm. Grossly normal heart sounds.  Good peripheral circulation. Respiratory: Normal respiratory effort.  No retractions. Lungs CTAB. Musculoskeletal: No obvious deformity to the left ankle. Mild edema to the lateral aspect the left ankle. Moderate guarding palpation distal fibular.  Neurologic:  Normal speech and language. No gross focal neurologic deficits are appreciated. No gait instability. Skin:  Skin is warm, dry and intact. No rash noted. No ecchymosis or abrasions. Psychiatric: Mood and affect are normal. Speech and behavior are normal.  ____________________________________________   LABS (all labs ordered are listed, but only abnormal results are displayed)  Labs Reviewed - No data to display ____________________________________________  EKG   ____________________________________________  RADIOLOGY  Dg Ankle Complete Left  Result  Date: 09/18/2017 CLINICAL DATA:  Twisting injury climbing down steps EXAM: LEFT ANKLE COMPLETE - 3+ VIEW COMPARISON:  None. FINDINGS: Frontal, oblique, and lateral views obtained. There is soft tissue swelling with joint effusion. No evident fracture. No appreciable joint space narrowing or erosion. Ankle mortise appears intact. IMPRESSION: Soft tissue swelling with joint effusion. Suspect ligamentous injury. No fracture evident. No appreciable  arthropathy. Ankle mortise appears intact. Electronically Signed   By: Lowella Grip III M.D.   On: 09/18/2017 08:25    _No ankle fracture, x-ray consistent with sprain. ___________________________________________   PROCEDURES  Procedure(s) performed: None  Procedures  Critical Care performed: No  ____________________________________________   INITIAL IMPRESSION / ASSESSMENT AND PLAN / ED COURSE  Pertinent labs & imaging results that were available during my care of the patient were reviewed by me and considered in my medical decision making (see chart for details).  Left ankle pain secondary to sprain. Discussed x-ray finding with patient. Patient given discharge care instructions. Patient advised to wear splint for 3-5 days as needed. Take medication as directed. Advised to follow-up with the "clinic if condition persists.      ____________________________________________   FINAL CLINICAL IMPRESSION(S) / ED DIAGNOSES  Final diagnoses:  Sprain of left ankle, unspecified ligament, initial encounter      NEW MEDICATIONS STARTED DURING THIS VISIT:  New Prescriptions   NAPROXEN (NAPROSYN) 500 MG TABLET    Take 1 tablet (500 mg total) by mouth 2 (two) times daily with a meal.   TRAMADOL (ULTRAM) 50 MG TABLET    Take 1 tablet (50 mg total) by mouth every 6 (six) hours as needed for moderate pain.     Note:  This document was prepared using Dragon voice recognition software and may include unintentional dictation errors.    Sable Feil, PA-C 09/18/17 5883    Earleen Newport, MD 09/18/17 [phone]

## 2017-09-18 NOTE — ED Triage Notes (Signed)
Pt reports injuring left ankle yesterday on a step. Pt ambulatory.

## 2017-09-18 NOTE — ED Notes (Signed)
Patient reports missing step on camper while stepping down, twisting ankle and hearing a pop sound. Left ankle lateral side. Slight swelling noted, pain starts and ankle and progresses 1/3 way up. Pain is 6/10, denies need for pain medication.

## 2017-09-18 NOTE — ED Notes (Signed)
Ice pack placed to patient's left ankle.

## 2017-09-19 ENCOUNTER — Encounter: Payer: Self-pay | Admitting: Family Medicine

## 2018-03-04 ENCOUNTER — Encounter: Payer: Self-pay | Admitting: Certified Nurse Midwife

## 2018-10-15 ENCOUNTER — Emergency Department
Admission: EM | Admit: 2018-10-15 | Discharge: 2018-10-15 | Disposition: A | Discharge status: Other Acute Inpatient Hospital | Payer: Self-pay | Attending: Emergency Medicine | Admitting: Emergency Medicine

## 2018-10-15 ENCOUNTER — Encounter: Payer: Self-pay | Admitting: Psychiatry

## 2018-10-15 ENCOUNTER — Inpatient Hospital Stay
Admission: AD | Admit: 2018-10-15 | Discharge: 2018-10-16 | DRG: 885 | Disposition: A | Discharge status: Home / Self Care | Payer: No Typology Code available for payment source | Source: Intra-hospital | Attending: Psychiatry | Admitting: Psychiatry

## 2018-10-15 ENCOUNTER — Other Ambulatory Visit: Payer: Self-pay

## 2018-10-15 DIAGNOSIS — F17210 Nicotine dependence, cigarettes, uncomplicated: Secondary | ICD-10-CM | POA: Diagnosis present

## 2018-10-15 DIAGNOSIS — Z915 Personal history of self-harm: Secondary | ICD-10-CM | POA: Diagnosis not present

## 2018-10-15 DIAGNOSIS — F909 Attention-deficit hyperactivity disorder, unspecified type: Secondary | ICD-10-CM | POA: Diagnosis present

## 2018-10-15 DIAGNOSIS — F1520 Other stimulant dependence, uncomplicated: Secondary | ICD-10-CM | POA: Diagnosis present

## 2018-10-15 DIAGNOSIS — R45851 Suicidal ideations: Secondary | ICD-10-CM | POA: Diagnosis present

## 2018-10-15 DIAGNOSIS — F1220 Cannabis dependence, uncomplicated: Secondary | ICD-10-CM | POA: Diagnosis present

## 2018-10-15 DIAGNOSIS — J45909 Unspecified asthma, uncomplicated: Secondary | ICD-10-CM | POA: Diagnosis present

## 2018-10-15 DIAGNOSIS — F329 Major depressive disorder, single episode, unspecified: Secondary | ICD-10-CM | POA: Insufficient documentation

## 2018-10-15 DIAGNOSIS — G43909 Migraine, unspecified, not intractable, without status migrainosus: Secondary | ICD-10-CM | POA: Diagnosis present

## 2018-10-15 DIAGNOSIS — Z833 Family history of diabetes mellitus: Secondary | ICD-10-CM

## 2018-10-15 DIAGNOSIS — F4310 Post-traumatic stress disorder, unspecified: Secondary | ICD-10-CM

## 2018-10-15 DIAGNOSIS — F32A Depression, unspecified: Secondary | ICD-10-CM

## 2018-10-15 DIAGNOSIS — F6089 Other specific personality disorders: Secondary | ICD-10-CM

## 2018-10-15 DIAGNOSIS — F332 Major depressive disorder, recurrent severe without psychotic features: Principal | ICD-10-CM | POA: Diagnosis present

## 2018-10-15 LAB — URINE DRUG SCREEN, QUALITATIVE (ARMC ONLY)
AMPHETAMINES, UR SCREEN: POSITIVE — AB
Barbiturates, Ur Screen: NOT DETECTED
Benzodiazepine, Ur Scrn: NOT DETECTED
CANNABINOID 50 NG, UR ~~LOC~~: POSITIVE — AB
COCAINE METABOLITE, UR ~~LOC~~: NOT DETECTED
MDMA (Ecstasy)Ur Screen: NOT DETECTED
Methadone Scn, Ur: NOT DETECTED
OPIATE, UR SCREEN: NOT DETECTED
PHENCYCLIDINE (PCP) UR S: NOT DETECTED
Tricyclic, Ur Screen: NOT DETECTED

## 2018-10-15 LAB — CBC
HEMATOCRIT: 42.8 % (ref 36.0–46.0)
Hemoglobin: 14.1 g/dL (ref 12.0–15.0)
MCH: 30.1 pg (ref 26.0–34.0)
MCHC: 32.9 g/dL (ref 30.0–36.0)
MCV: 91.5 fL (ref 80.0–100.0)
PLATELETS: 257 10*3/uL (ref 150–400)
RBC: 4.68 MIL/uL (ref 3.87–5.11)
RDW: 12.3 % (ref 11.5–15.5)
WBC: 12.8 10*3/uL — ABNORMAL HIGH (ref 4.0–10.5)
nRBC: 0 % (ref 0.0–0.2)

## 2018-10-15 LAB — POCT PREGNANCY, URINE: PREG TEST UR: NEGATIVE

## 2018-10-15 LAB — SALICYLATE LEVEL

## 2018-10-15 LAB — COMPREHENSIVE METABOLIC PANEL
ALK PHOS: 66 U/L (ref 38–126)
ALT: 15 U/L (ref 0–44)
AST: 16 U/L (ref 15–41)
Albumin: 4.3 g/dL (ref 3.5–5.0)
Anion gap: 6 (ref 5–15)
BUN: 18 mg/dL (ref 6–20)
CALCIUM: 8.9 mg/dL (ref 8.9–10.3)
CO2: 27 mmol/L (ref 22–32)
CREATININE: 0.83 mg/dL (ref 0.44–1.00)
Chloride: 108 mmol/L (ref 98–111)
Glucose, Bld: 108 mg/dL — ABNORMAL HIGH (ref 70–99)
Potassium: 3.9 mmol/L (ref 3.5–5.1)
Sodium: 141 mmol/L (ref 135–145)
Total Bilirubin: 0.4 mg/dL (ref 0.3–1.2)
Total Protein: 7.2 g/dL (ref 6.5–8.1)

## 2018-10-15 LAB — ACETAMINOPHEN LEVEL: Acetaminophen (Tylenol), Serum: <10 ug/mL — ABNORMAL LOW (ref 10–30)

## 2018-10-15 LAB — ETHANOL: Alcohol, Ethyl (B): <10 mg/dL (ref <10)

## 2018-10-15 MED ORDER — TRAZODONE HCL 100 MG PO TABS: 100.0000 mg | ORAL_TABLET | Freq: Every evening | ORAL | Status: DC | PRN

## 2018-10-15 MED ORDER — HYDROXYZINE HCL 50 MG PO TABS
50.0000 mg | ORAL_TABLET | Freq: Three times a day (TID) | ORAL | Status: DC | PRN
  Administered 2018-10-15: 50 mg via ORAL
  Filled 2018-10-15: qty 1

## 2018-10-15 MED ORDER — ALUM & MAG HYDROXIDE-SIMETH 200-200-20 MG/5ML PO SUSP: 30.0000 mL | ORAL | Status: DC | PRN

## 2018-10-15 MED ORDER — MAGNESIUM HYDROXIDE 400 MG/5ML PO SUSP: 30.0000 mL | Freq: Every day | ORAL | Status: DC | PRN

## 2018-10-15 MED ORDER — ACETAMINOPHEN 325 MG PO TABS: 650.0000 mg | ORAL_TABLET | Freq: Four times a day (QID) | ORAL | Status: DC | PRN

## 2018-10-15 NOTE — ED Notes (Signed)
Patient talking to Dr. Clapacs 

## 2018-10-15 NOTE — ED Notes (Signed)
Patient taking a shower.

## 2018-10-15 NOTE — Progress Notes (Signed)
Patient is sad,irritable and verbally abusive  during admission assessment.Patient states that she wants to be with her pets.Patient calm down after sometime and took Vistaril. Patient denies SI/HI at this time. Patient denies AVH. Patient informed of fall risk status, fall risk assessed "low" at this time. Patient oriented to unit/staff/room. Patient denies any questions/concerns at this time. Patient safe on unit with Q15 minute checks for safety.Skin assessment and body search done,no contraband found. Patient got superficial cuts on Left thigh.

## 2018-10-15 NOTE — ED Notes (Signed)
Pt. Transferred to [place] from ED to room 5 after screening for contraband. Report to include Situation, Background, Assessment and Recommendations from [place]. Pt. Oriented to unit including Q15 minute rounds as well as the security cameras for their protection. Patient is alert and oriented, warm and dry in no acute distress. Patient refuses to answer questions concerning SI, HI, and AVH. Pt. Encouraged to let me know if needs arise.

## 2018-10-15 NOTE — ED Notes (Signed)
Pt dressed out into hospital provided burgundy scrubs. Belongings include: 3 nose rings 1 tongue ring 1 eyebrow ring 2 bellybutton rings 2 nipple rings 1 ear ring 1 iph[employer] Black converses Socks Long sleeve shirt hairbow Grey workout leggings Pink pair of panties [employer]

## 2018-10-15 NOTE — ED Triage Notes (Signed)
Patient states she is here because she wants to kill herself.  States she would jump off a bridge or put a leash around her neck.  Patient also reports that she self harms and last time was last night.
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
## 2018-10-15 NOTE — ED Notes (Addendum)
Patient talking with mother Diane Powell on phone

## 2018-10-15 NOTE — ED Provider Notes (Signed)
[facility] Emergency Department Provider Note  ____________________________________________   First MD Initiated Contact with Patient 10/15/18 0154     (approximate)  I have reviewed the triage vital signs and the nursing notes.   HISTORY  Chief Complaint Psychiatric Evaluation    HPI Areta Terwilliger is a 32 y.o. female with no contributory past medical history who presents in the company of law enforcement for evaluation of depression and suicidal ideation.  She says that she has felt depressed for very long time, likely months or longer, and that it has gradually been getting worse.  She has started having thoughts about killing herself and tonight he got much worse and she wrapped a dog leash around her neck and the parents have to kill herself.  Her boyfriend called 911 and she voluntarily came to the ED for evaluation.  She reports that her symptoms are severe and nothing helps or makes worse.  She recently started seeing a therapist last week but is on no medication.  She denies neck pain, chest pain, headache, shortness of breath, nausea, vomiting, and abdominal pain.  Past Medical History:  Diagnosis Date  . Asthma   . Migraines     There are no active problems to display for this patient.   Past Surgical History:  Procedure Laterality Date  . CYST REMOVAL TRUNK    . TONSILLECTOMY      Prior to Admission medications   Not on File    Allergies Other  Family History  Problem Relation Age of Onset  . Endometriosis Mother   . Diabetes Father     Social History Social History   Tobacco Use  . Smoking status: Not on file  Substance Use Topics  . Alcohol use: No    Alcohol/week: 0.0 standard drinks  . Drug use: No    Review of Systems Constitutional: No fever/chills Eyes: No visual changes. ENT: No sore throat. Cardiovascular: Denies chest pain. Respiratory: Denies shortness of breath. Gastrointestinal: No abdominal  pain.  No nausea, no vomiting.  No diarrhea.  No constipation. Genitourinary: Negative for dysuria. Musculoskeletal: Negative for neck pain.  Negative for back pain. Integumentary: Negative for rash. Neurological: Negative for headaches, focal weakness or numbness. Psychiatric:Depression and suicidal ideation with allegedly attempted to strangle herself tonight.  ____________________________________________   PHYSICAL EXAM:  VITAL SIGNS: ED Triage Vitals  Enc Vitals Group     BP 10/15/18 0026 122/89     Pulse Rate 10/15/18 0026 (!) 125     Resp 10/15/18 0026 20     Temp 10/15/18 0026 97.7 F (36.5 C)     Temp Source 10/15/18 0026 Oral     SpO2 10/15/18 0026 100 %     Weight 10/15/18 0024 62.1 kg (137 lb)     Height 10/15/18 0024 1.651 m (5\' 5" )     Head Circumference --      Peak Flow --      Pain Score --      Pain Loc --      Pain Edu? --      Excl. in [place]? --     Constitutional: Alert and oriented. Well appearing and in no acute distress. Eyes: Conjunctivae are normal.  Head: Atraumatic. Nose: No congestion/rhinnorhea. Mouth/Throat: Mucous membranes are moist. Neck: No stridor.  No meningeal signs.  No cervical spine tenderness to palpation.  No carotid bruits.  The patient has no ligature marks on her neck. Cardiovascular: Normal rate, regular rhythm. Good  peripheral circulation. Grossly normal heart sounds. Respiratory: Normal respiratory effort.  No retractions. Lungs CTAB. Gastrointestinal: Soft and nontender. No distention.  Musculoskeletal: No lower extremity tenderness nor edema. No gross deformities of extremities. Neurologic:  Normal speech and language. No gross focal neurologic deficits are appreciated.  Skin:  Skin is warm, dry and intact. No rash noted. Psychiatric: Mood and affect are depressed.  The patient is calm and cooperative but reporting depression and persistent suicidal ideation.  ____________________________________________   LABS (all labs  ordered are listed, but only abnormal results are displayed)  Labs Reviewed  COMPREHENSIVE METABOLIC PANEL - Abnormal; Notable for the following components:      Result Value   Glucose, Bld 108 (*)    All other components within normal limits  ACETAMINOPHEN LEVEL - Abnormal; Notable for the following components:   Acetaminophen (Tylenol), Serum <10 (*)    All other components within normal limits  CBC - Abnormal; Notable for the following components:   WBC 12.8 (*)    All other components within normal limits  URINE DRUG SCREEN, QUALITATIVE ([facility] ONLY) - Abnormal; Notable for the following components:   Amphetamines, Ur Screen POSITIVE (*)    Cannabinoid 50 Ng, Ur [place] POSITIVE (*)    All other components within normal limits  ETHANOL  SALICYLATE LEVEL  POC URINE PREG, ED  POCT PREGNANCY, URINE   ____________________________________________  EKG  None - EKG not ordered by ED physician ____________________________________________  RADIOLOGY   ED MD interpretation: No indication for imaging  Official radiology report(s): No results found.  ____________________________________________   PROCEDURES  Critical Care performed: No   Procedure(s) performed:   Procedures   ____________________________________________   INITIAL IMPRESSION / ASSESSMENT AND PLAN / ED COURSE  As part of my medical decision making, I reviewed the following data within the [place] notes reviewed and incorporated, Labs reviewed , A consult was requested and obtained from this/these consultant(s) Psychiatry and Notes from prior ED visits    Differential diagnosis includes, but is not limited to, major depressive disorder, adjustment disorder, mood disorder.  She has no evidence of any acute medical issue or injury as result of her strangulation episode tonight with a reassuring physical exam and no acute distress.  The initial tachycardia in triage was likely due to  anxiety because that has completely resolved and she is in no distress lying in bed and trying to sleep.  I have upheld her involuntary commitment order and ordered TTS consult.  Lab work is reassuring with a very mild leukocytosis of unclear significance, normal CMP, negative salicylate level, acetaminophen level, and alcohol level.  Urine drug screen is positive for amphetamines and cannabinoids.  Urine pregnancy test is negative.  She is medically cleared at this time for psychiatric disposition.  Clinical Course as of Oct 15 353  Tue Oct 15, 2018  0353 Evaluated by TTS who feels the patient is appropriate for admission to the behavioral unit.  She will be taken downstairs when a bed is available in the morning.   [CF]    Clinical Course User Index [CF] Hinda Kehr, MD    ____________________________________________  FINAL CLINICAL IMPRESSION(S) / ED DIAGNOSES  Final diagnoses:  Depression, unspecified depression type  Suicidal ideation     MEDICATIONS GIVEN DURING THIS VISIT:  Medications - No data to display   ED Discharge Orders    None       Note:  This document was prepared using Dragon voice  recognition software and may include unintentional dictation errors.    Hinda Kehr, MD 10/15/18 [phone]

## 2018-10-15 NOTE — ED Notes (Signed)
Patient alert and oriented x4. Patient tearful during assessment. Patient states she was feeling suicidal earlier and attempted suicide by putting a dog leash around her neck. Patient states her boyfriend was able to remove and call police. Patient states she self harmed yesterday and mainly cuts on her thigh and left forearm. Patient states she had a past attempt when she was a teenage by overdosing on medications. Patient states she just started therapy last week and is to return this Friday to come up with a treatment plan. Patient denies SI/HI/AVH during assessment. Patient able to contract with this writer if she starts to have any SI.
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
## 2018-10-15 NOTE — ED Notes (Signed)
Hourly rounding reveals patient sleeping in room. No complaints, stable, in no acute distress. Q15 minute rounds and monitoring via Security Cameras to continue. 
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
## 2018-10-16 DIAGNOSIS — F4310 Post-traumatic stress disorder, unspecified: Secondary | ICD-10-CM

## 2018-10-16 DIAGNOSIS — F6089 Other specific personality disorders: Secondary | ICD-10-CM

## 2018-10-16 LAB — LIPID PANEL
Cholesterol: 152 mg/dL (ref 0–200)
HDL: 32 mg/dL — AB (ref >40)
LDL Cholesterol: 100 mg/dL — ABNORMAL HIGH (ref 0–99)
Total CHOL/HDL Ratio: 4.8 RATIO
Triglycerides: 99 mg/dL (ref <150)
VLDL: 20 mg/dL (ref 0–40)

## 2018-10-16 LAB — HEMOGLOBIN A1C
HEMOGLOBIN A1C: 5.1 % (ref 4.8–5.6)
MEAN PLASMA GLUCOSE: 99.67 mg/dL

## 2018-10-16 LAB — TSH: TSH: 1.842 u[IU]/mL (ref 0.350–4.500)

## 2018-10-16 MED ORDER — HYDROXYZINE HCL 50 MG PO TABS: 50.0000 mg | ORAL_TABLET | Freq: Three times a day (TID) | ORAL | 1 refills | Status: DC | PRN

## 2018-10-16 MED ORDER — SERTRALINE HCL 50 MG PO TABS: 50.0000 mg | ORAL_TABLET | Freq: Every day | ORAL | 1 refills | Status: DC
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
## 2018-10-16 NOTE — Plan of Care (Signed)
Patient is calm, denies any SI/H/AVH, isolate to her room, trying to be left alone, states "I just want to be alone in my room" , patient room is facing the nursing station, for observation and frequent monitoring, and 15 minute rounding is maintained, therapeutic support and encouragement is provided , patient understood information provided , patient states "I will be fine " patient is adjusting to the unit nicely, no distress noted, safety is maintained .   Problem: Education: Goal: Knowledge of [place] [facility] Education information/materials will improve Outcome: Progressing Goal: Emotional status will improve Outcome: Progressing Goal: Mental status will improve Outcome: Progressing Goal: Verbalization of understanding the information provided will improve Outcome: Progressing   Problem: Health Behavior/Discharge Planning: Goal: Identification of resources available to assist in meeting health care needs will improve Outcome: Progressing Goal: Compliance with treatment plan for underlying cause of condition will improve Outcome: Progressing   Problem: Education: Goal: Utilization of techniques to improve thought processes will improve Outcome: Progressing Goal: Knowledge of the prescribed therapeutic regimen will improve Outcome: Progressing   Problem: Coping: Goal: Coping ability will improve Outcome: Progressing Goal: Will verbalize feelings Outcome: Progressing   Problem: Role Relationship: Goal: Will demonstrate positive changes in social behaviors and relationships Outcome: Progressing   Problem: Education: Goal: Knowledge of General Education information will improve Description Including pain rating scale, medication(s)/side effects and non-pharmacologic comfort measures Outcome: Progressing   Problem: Health Behavior/Discharge Planning: Goal: Ability to manage health-related needs will improve Outcome: Progressing   Problem: Clinical Measurements: Goal:  Ability to maintain clinical measurements within normal limits will improve Outcome: Progressing Goal: Will remain free from infection Outcome: Progressing Goal: Diagnostic test results will improve Outcome: Progressing Goal: Respiratory complications will improve Outcome: Progressing Goal: Cardiovascular complication will be avoided Outcome: Progressing   Problem: Activity: Goal: Risk for activity intolerance will decrease Outcome: Progressing   Problem: Nutrition: Goal: Adequate nutrition will be maintained Outcome: Progressing   Problem: Coping: Goal: Level of anxiety will decrease Outcome: Progressing   Problem: Elimination: Goal: Will not experience complications related to bowel motility Outcome: Progressing Goal: Will not experience complications related to urinary retention Outcome: Progressing   Problem: Pain Managment: Goal: General experience of comfort will improve Outcome: Progressing   Problem: Safety: Goal: Ability to remain free from injury will improve Outcome: Progressing   Problem: Skin Integrity: Goal: Risk for impaired skin integrity will decrease Outcome: Progressing
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
## 2018-10-16 NOTE — Progress Notes (Signed)
Recreation Therapy Notes   Date: 10/16/2018  Time: 9:30 am  Location: Craft Room  Behavioral response: Appropriate    Intervention Topic: Communication  Discussion/Intervention:  Group content today was focused on communication. The group defined communication and ways to communicate with others. Individuals stated reason why communication is important and some reasons to communicate with others. Patients expressed if they thought they were good at communicating with others and ways they could improve their communication skills. The group identified important parts of communication and some experiences they have had in the past with communication. The group participated in the intervention "What is that?", where they had a chance to test out their communication skills and identify ways to improve their communication techniques.  Clinical Observations/Feedback:  Patient came to group late due to unknown reasons. Individual was pulled from group by the doctor.  Flecia Shutter LRT/CTRS         Urho Rio 10/16/2018 11:52 AM

## 2018-10-16 NOTE — Progress Notes (Signed)
   10/16/18 1030  Clinical Encounter Type  Visited With Patient;Health care provider  Visit Type Initial;Spiritual support;Behavioral Health  Referral From Social work  Consult/Referral To Chaplain  Spiritual Encounters  Spiritual Needs Emotional;Other (Comment)   [name] attended the patient's treatment team meeting. Ms. Diane Powell will be going to her mother's home upon discharge. She stated that her goals are to fix herself, open up and learn to cope.

## 2018-10-16 NOTE — Progress Notes (Signed)
  [facility] Adult Case Management Discharge Plan :  Will you be returning to the same living situation after discharge:  Yes,  Home with mother At discharge, do you have transportation home?: Yes,  Mother coming at discharge Do you have the ability to pay for your medications: Yes,  Referred to [facility]  Release of information consent forms completed and in the chart;  Patient's signature needed at discharge.  Patient to Follow up at: Follow-up Information    [facility] on 10/21/2018.   Why:  Please follow up with Sherrian Divers for Peer Support Services on Monday October 21, 2018 at 7:15am. Please bring your ID, medications and discharge paperwork. Thank you. Contact information: [place] [address] [phone]           Next level of care provider has access to [place] and Suicide Prevention discussed: Yes,  Completed with patient and mother  Have you used any form of tobacco in the last 30 days? (Cigarettes, Smokeless Tobacco, Cigars, and/or Pipes): Yes  Has patient been referred to the Quitline?: Patient refused referral  Patient has been referred for addiction treatment: Yes  Darin Engels, LCSW 10/16/2018, 11:39 AM

## 2018-10-16 NOTE — BHH Suicide Risk Assessment (Signed)
[facility] INPATIENT:  Family/Significant Other Suicide Prevention Education  Suicide Prevention Education:  Education Completed; Valary Manahan, mother, [phone] has been identified by the patient as the family member/significant other with whom the patient will be residing, and identified as the person(s) who will aid the patient in the event of a mental health crisis (suicidal ideations/suicide attempt).  With written consent from the patient, the family member/significant other has been provided the following suicide prevention education, prior to the and/or following the discharge of the patient.  The suicide prevention education provided includes the following:  Suicide risk factors  Suicide prevention and interventions  National Suicide Hotline telephone number  [facility] assessment telephone number  [facility] Emergency Assistance [place] and/or Residential Mobile Crisis Unit telephone number  Request made of family/significant other to:  Remove weapons (e.g., guns, rifles, knives), all items previously/currently identified as safety concern.    Remove drugs/medications (over-the-counter, prescriptions, illicit drugs), all items previously/currently identified as a safety concern.  The family member/significant other verbalizes understanding of the suicide prevention education information provided.  The family member/significant other agrees to remove the items of safety concern listed above.  Mother reports she will be picking pt up at discharge. Mother states pt will be returning to her home and denies any access to guns or weapons in the home. Mother says she has no reservation about pt returning home and is looking forward to participating in upcoming family therapy session.  Zymiere Trostle T Vittoria Noreen 10/16/2018, 11:40 AM

## 2018-10-16 NOTE — Progress Notes (Signed)
Patient alert and oriented x 4. Ambulates unit with steady gait. Verbally denies SI/HI/AVH and pain. Patient discharged on above date and time. Verbalized understanding the discharge information provided to patient upon discharge. Patient departed unit with discharge paperwork, prescriptions and personal belongings. Picked up by her  mother to go home and follow up Monday, November 4th at [facility].

## 2019-03-07 ENCOUNTER — Emergency Department
Admission: EM | Admit: 2019-03-07 | Discharge: 2019-03-07 | Disposition: A | Discharge status: Home / Self Care | Payer: Self-pay | Attending: Emergency Medicine | Admitting: Emergency Medicine

## 2019-03-07 ENCOUNTER — Emergency Department: Payer: Self-pay

## 2019-03-07 ENCOUNTER — Encounter: Payer: Self-pay | Admitting: Emergency Medicine

## 2019-03-07 ENCOUNTER — Other Ambulatory Visit: Payer: Self-pay

## 2019-03-07 DIAGNOSIS — J45909 Unspecified asthma, uncomplicated: Secondary | ICD-10-CM | POA: Insufficient documentation

## 2019-03-07 DIAGNOSIS — K529 Noninfective gastroenteritis and colitis, unspecified: Secondary | ICD-10-CM

## 2019-03-07 DIAGNOSIS — Z79899 Other long term (current) drug therapy: Secondary | ICD-10-CM | POA: Insufficient documentation

## 2019-03-07 DIAGNOSIS — F17200 Nicotine dependence, unspecified, uncomplicated: Secondary | ICD-10-CM | POA: Insufficient documentation

## 2019-03-07 LAB — URINALYSIS, COMPLETE (UACMP) WITH MICROSCOPIC
Bilirubin Urine: NEGATIVE
Glucose, UA: NEGATIVE mg/dL
Hgb urine dipstick: NEGATIVE
Ketones, ur: 5 mg/dL — AB
Leukocytes,Ua: NEGATIVE
Nitrite: NEGATIVE
Protein, ur: 30 mg/dL — AB
Specific Gravity, Urine: 1.034 — ABNORMAL HIGH (ref 1.005–1.030)
pH: 5 (ref 5.0–8.0)

## 2019-03-07 LAB — CBC
HCT: 41.8 % (ref 36.0–46.0)
Hemoglobin: 14.2 g/dL (ref 12.0–15.0)
MCH: 29.3 pg (ref 26.0–34.0)
MCHC: 34 g/dL (ref 30.0–36.0)
MCV: 86.2 fL (ref 80.0–100.0)
Platelets: 220 10*3/uL (ref 150–400)
RBC: 4.85 MIL/uL (ref 3.87–5.11)
RDW: 13.3 % (ref 11.5–15.5)
WBC: 10.5 10*3/uL (ref 4.0–10.5)
nRBC: 0 % (ref 0.0–0.2)

## 2019-03-07 LAB — COMPREHENSIVE METABOLIC PANEL
ALT: 21 U/L (ref 0–44)
AST: 37 U/L (ref 15–41)
Albumin: 3.9 g/dL (ref 3.5–5.0)
Alkaline Phosphatase: 54 U/L (ref 38–126)
Anion gap: 8 (ref 5–15)
BUN: 20 mg/dL (ref 6–20)
CO2: 24 mmol/L (ref 22–32)
Calcium: 8.5 mg/dL — ABNORMAL LOW (ref 8.9–10.3)
Chloride: 106 mmol/L (ref 98–111)
Creatinine, Ser: 0.66 mg/dL (ref 0.44–1.00)
GFR calc Af Amer: >60 mL/min (ref >60)
GFR calc non Af Amer: >60 mL/min (ref >60)
Glucose, Bld: 137 mg/dL — ABNORMAL HIGH (ref 70–99)
Potassium: 3.4 mmol/L — ABNORMAL LOW (ref 3.5–5.1)
Sodium: 138 mmol/L (ref 135–145)
Total Bilirubin: 0.3 mg/dL (ref 0.3–1.2)
Total Protein: 7.3 g/dL (ref 6.5–8.1)

## 2019-03-07 LAB — LIPASE, BLOOD: Lipase: 22 U/L (ref 11–51)

## 2019-03-07 LAB — POCT PREGNANCY, URINE: Preg Test, Ur: NEGATIVE

## 2019-03-07 MED ORDER — SODIUM CHLORIDE 0.9 % IV BOLUS
1000.0000 mL | Freq: Once | INTRAVENOUS | Status: AC
  Administered 2019-03-07: 1000 mL via INTRAVENOUS

## 2019-03-07 MED ORDER — IOPAMIDOL (ISOVUE-300) INJECTION 61%
30.0000 mL | Freq: Once | INTRAVENOUS | Status: AC
  Administered 2019-03-07: 30 mL via ORAL

## 2019-03-07 MED ORDER — ONDANSETRON 4 MG PO TBDP: 4.0000 mg | ORAL_TABLET | Freq: Three times a day (TID) | ORAL | 0 refills | Status: DC | PRN

## 2019-03-07 MED ORDER — AMOXICILLIN-POT CLAVULANATE 875-125 MG PO TABS: 1.0000 | ORAL_TABLET | Freq: Two times a day (BID) | ORAL | 0 refills | Status: AC

## 2019-03-07 MED ORDER — ONDANSETRON 4 MG PO TBDP
4.0000 mg | ORAL_TABLET | Freq: Once | ORAL | Status: AC | PRN
  Administered 2019-03-07: 4 mg via ORAL
  Filled 2019-03-07: qty 1

## 2019-03-07 MED ORDER — KETOROLAC TROMETHAMINE 30 MG/ML IJ SOLN
30.0000 mg | Freq: Once | INTRAMUSCULAR | Status: AC
  Administered 2019-03-07: 30 mg via INTRAVENOUS
  Filled 2019-03-07: qty 1

## 2019-03-07 MED ORDER — IOHEXOL 300 MG/ML  SOLN
100.0000 mL | Freq: Once | INTRAMUSCULAR | Status: AC | PRN
  Administered 2019-03-07: 100 mL via INTRAVENOUS

## 2019-03-07 NOTE — ED Notes (Signed)
Patient transported to CT 

## 2019-03-07 NOTE — ED Triage Notes (Signed)
Patient presents to the ED with nausea, vomiting and diarrhea with abdominal pain x 3 days.  Patient reports vomiting >10 times and diarrhea x 5 in the past 24 hours.  Patient states she has not been able to eat or drink well.

## 2019-03-07 NOTE — ED Provider Notes (Signed)
[facility] Emergency Department Provider Note  Time seen: 6:27 PM  I have reviewed the triage vital signs and the nursing notes.   HISTORY  Chief Complaint Emesis; Diarrhea; and Abdominal Pain    HPI Diane Powell is a 33 y.o. female with no significant past medical history presents to the emergency department for nausea vomiting diarrhea and abdominal pain.  According to the patient for the past 3 days she has had diffuse abdominal pain describes it as moderate to severe pain.  Patient has been having nausea vomiting and diarrhea as well.  States greater than 10 episodes of vomiting and diarrhea today.  Unable to keep any food down.  Denies any known fever, cough, congestion.  No travel.   Past Medical History:  Diagnosis Date  . Asthma   . Migraines     Patient Active Problem List   Diagnosis Date Noted  . PTSD (post-traumatic stress disorder) 10/16/2018  . Cluster B personality disorder ([place]) 10/16/2018  . Cannabis use disorder, moderate, dependence ([place]) 10/15/2018  . Amphetamine use disorder, severe ([place]) 10/15/2018  . 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 Suicidal ideation 10/15/2018    Past Surgical History:  Procedure Laterality Date  . CYST REMOVAL TRUNK    . TONSILLECTOMY      Prior to Admission medications   Medication Sig Start Date End Date Taking? Authorizing Provider  hydrOXYzine (ATARAX/VISTARIL) 50 MG tablet Take 1 tablet (50 mg total) by mouth 3 (three) times daily as needed for anxiety. 10/16/18   McNew, Tyson Babinski, MD  sertraline (ZOLOFT) 50 MG tablet Take 1 tablet (50 mg total) by mouth daily. 10/16/18 10/16/19  McNew, Tyson Babinski, MD    Allergies  Allergen Reactions  . Other     Pt states she has an allergy to a medication that starts with the letter Z. Pt states it was given IV and that she was very hot and itchy.     Family History  Problem Relation Age of Onset   . Endometriosis Mother   . Diabetes Father     Social History Social History   Tobacco Use  . Smoking status: Current Every Day Smoker    Packs/day: 0.50  . Smokeless tobacco: Never Used  Substance Use Topics  . Alcohol use: No    Alcohol/week: 0.0 standard drinks  . Drug use: Yes    Frequency: 6.0 times per week    Types: Marijuana    Review of Systems Constitutional: Negative for fever. Cardiovascular: Negative for chest pain. Respiratory: Negative for shortness of breath. Gastrointestinal: Nausea for diffuse abdominal discomfort.  Positive for nausea vomiting diarrhea. Genitourinary: Negative for urinary compaints Musculoskeletal: Negative for musculoskeletal complaints Skin: Negative for skin complaints  Neurological: Negative for headache All other ROS negative  ____________________________________________   PHYSICAL EXAM:  VITAL SIGNS: ED Triage Vitals  Enc Vitals Group     BP 03/07/19 1710 113/79     Pulse Rate 03/07/19 1710 100     Resp 03/07/19 1710 16     Temp 03/07/19 1710 98.3 F (36.8 C)     Temp Source 03/07/19 1710 Oral     SpO2 03/07/19 1710 100 %     Weight 03/07/19 1712 135 lb (61.2 kg)     Height 03/07/19 1712 5\' 5"  (1.651 m)     Head Circumference --      Peak Flow --      Pain Score 03/07/19 1712 9  Pain Loc --      Pain Edu? --      Excl. in [place]? --    Constitutional: Alert and oriented. Well appearing and in no distress. Eyes: Normal exam ENT   Head: Normocephalic and atraumatic.   Mouth/Throat: Mucous membranes are moist. Cardiovascular: Normal rate, regular rhythm. No murmurs, rubs, or gallops. Respiratory: Normal respiratory effort without tachypnea nor retractions. Breath sounds are clear  Gastrointestinal: Soft, moderate diffuse tenderness palpation without rebound guarding or distention. Musculoskeletal: Nontender with normal range of motion in all extremities. Neurologic:  Normal speech and language. No gross focal  neurologic deficits  Skin:  Skin is warm, dry and intact.  Psychiatric: Mood and affect are normal. Speech and behavior are normal.   ____________________________________________     RADIOLOGY  CT scan shows wall thickening of the terminal ileum and colon consistent with infectious or inflammatory bowel disease.  ____________________________________________   INITIAL IMPRESSION / ASSESSMENT AND PLAN / ED COURSE  Pertinent labs & imaging results that were available during my care of the patient were reviewed by me and considered in my medical decision making (see chart for details).  Patient presents to the emergency department for 3 days of nausea vomiting diarrhea and diffuse abdominal discomfort/pain.  Differential would include colitis, diverticulitis, gastroenteritis, gastritis, intra-abdominal pathology or infection.  We will check labs, CT scan of the abdomen and continue to closely monitor.  Patient's labs are largely within normal limits.  Patient is feeling somewhat better after medications.  Patient CT scan appears to show colitis, infectious versus inflammatory.  No history of inflammatory bowel disease per patient although the patient is young and this is a possibility.  We will cover the patient with Augmentin, prescribe a short course of pain and nausea medication have the patient follow-up with her doctor.  Patient agreeable to plan of care. _________________________________________   FINAL CLINICAL IMPRESSION(S) / ED DIAGNOSES  Colitis    Harvest Dark, MD 03/07/19 2030

## 2019-09-05 ENCOUNTER — Other Ambulatory Visit: Payer: Self-pay | Admitting: Student

## 2019-09-05 DIAGNOSIS — E0789 Other specified disorders of thyroid: Secondary | ICD-10-CM

## 2019-09-18 ENCOUNTER — Ambulatory Visit
Admission: RE | Admit: 2019-09-18 | Discharge: 2019-09-18 | Disposition: A | Discharge status: Home / Self Care | Payer: BC Managed Care – PPO | Source: Ambulatory Visit | Attending: Student | Admitting: Student

## 2019-09-18 ENCOUNTER — Other Ambulatory Visit: Payer: Self-pay

## 2019-09-18 DIAGNOSIS — E0789 Other specified disorders of thyroid: Secondary | ICD-10-CM | POA: Insufficient documentation

## 2019-10-09 ENCOUNTER — Other Ambulatory Visit: Payer: Self-pay | Admitting: Internal Medicine

## 2019-10-09 DIAGNOSIS — R221 Localized swelling, mass and lump, neck: Secondary | ICD-10-CM

## 2019-10-17 ENCOUNTER — Other Ambulatory Visit: Payer: Self-pay | Admitting: Radiology

## 2019-10-20 ENCOUNTER — Other Ambulatory Visit: Payer: Self-pay | Admitting: Internal Medicine

## 2019-10-20 ENCOUNTER — Other Ambulatory Visit: Payer: Self-pay

## 2019-10-20 ENCOUNTER — Ambulatory Visit
Admission: RE | Admit: 2019-10-20 | Discharge: 2019-10-20 | Disposition: A | Discharge status: Home / Self Care | Payer: BC Managed Care – PPO | Source: Ambulatory Visit | Attending: Internal Medicine | Admitting: Internal Medicine

## 2019-10-20 DIAGNOSIS — D34 Benign neoplasm of thyroid gland: Secondary | ICD-10-CM | POA: Diagnosis not present

## 2019-10-20 DIAGNOSIS — R221 Localized swelling, mass and lump, neck: Secondary | ICD-10-CM

## 2019-10-20 MED ORDER — SODIUM CHLORIDE 0.9 % IV SOLN: INTRAVENOUS | Status: DC

## 2019-10-20 NOTE — Procedures (Signed)
Interventional Radiology Procedure:   Indications: Indeterminate right neck nodule  Procedure: US guided FNA of right thyroid nodule/ectopic tissue US guided FNA of right cervical lymph node  Findings: Isoechoic nodule or ectopic thyroid inferior to right thyroid (4 FNAs obtained)  Prominent right cervical lymph node along the right IJ.  4 FNAs obtained.   Complications: None     EBL: less than 10 ml  Plan: Discharge to home.     Licia Harl R. Anselm Pancoast, MD  Pager: [phone]

## 2019-10-20 NOTE — Discharge Instructions (Signed)
Needle Biopsy, Care After °These instructions tell you how to care for yourself after your procedure. Your doctor may also give you more specific instructions. Call your doctor if you have any problems or questions. °What can I expect after the procedure? °After the procedure, it is common to have: °· Soreness. °· Bruising. °· Mild pain. °Follow these instructions at home: ° °· Return to your normal activities as told by your doctor. Ask your doctor what activities are safe for you. °· Take over-the-counter and prescription medicines only as told by your doctor. °· Wash your hands with soap and water before you change your bandage (dressing). If you cannot use soap and water, use hand sanitizer. °· Follow instructions from your doctor about: °? How to take care of your puncture site. °? When and how to change your bandage. °? When to remove your bandage. °· Check your puncture site every day for signs of infection. Watch for: °? Redness, swelling, or pain. °? Fluid or blood.  °? Pus or a bad smell. °? Warmth. °· Do not take baths, swim, or use a hot tub until your doctor approves. Ask your doctor if you may take showers. You may only be allowed to take sponge baths. °· Keep all follow-up visits as told by your doctor. This is important. °Contact a doctor if you have: °· A fever. °· Redness, swelling, or pain at the puncture site, and it lasts longer than a few days. °· Fluid, blood, or pus coming from the puncture site. °· Warmth coming from the puncture site. °Get help right away if: °· You have a lot of bleeding from the puncture site. °Summary °· After the procedure, it is common to have soreness, bruising, or mild pain at the puncture site. °· Check your puncture site every day for signs of infection, such as redness, swelling, or pain. °· Get help right away if you have severe bleeding from your puncture site. °This information is not intended to replace advice given to you by your health care provider. Make  sure you discuss any questions you have with your health care provider. °Document Released: 11/16/2008 Document Revised: 12/17/2017 Document Reviewed: 12/17/2017 °Elsevier Patient Education © 2020 Elsevier Inc. ° °

## 2019-10-21 ENCOUNTER — Encounter: Payer: Self-pay | Admitting: Internal Medicine

## 2019-10-21 LAB — CYTOLOGY - NON PAP

## 2019-11-25 ENCOUNTER — Encounter: Payer: Self-pay | Admitting: Emergency Medicine

## 2019-11-25 ENCOUNTER — Emergency Department
Admission: EM | Admit: 2019-11-25 | Discharge: 2019-11-25 | Disposition: A | Discharge status: Home / Self Care | Payer: BC Managed Care – PPO | Attending: Emergency Medicine | Admitting: Emergency Medicine

## 2019-11-25 ENCOUNTER — Other Ambulatory Visit: Payer: Self-pay

## 2019-11-25 DIAGNOSIS — Z79899 Other long term (current) drug therapy: Secondary | ICD-10-CM | POA: Insufficient documentation

## 2019-11-25 DIAGNOSIS — F17210 Nicotine dependence, cigarettes, uncomplicated: Secondary | ICD-10-CM | POA: Insufficient documentation

## 2019-11-25 DIAGNOSIS — J45909 Unspecified asthma, uncomplicated: Secondary | ICD-10-CM | POA: Insufficient documentation

## 2019-11-25 DIAGNOSIS — Z3201 Encounter for pregnancy test, result positive: Secondary | ICD-10-CM | POA: Insufficient documentation

## 2019-11-25 LAB — POCT PREGNANCY, URINE: Preg Test, Ur: NEGATIVE

## 2019-11-25 LAB — HCG, QUANTITATIVE, PREGNANCY: hCG, Beta Chain, Quant, S: 74 m[IU]/mL — ABNORMAL HIGH (ref <5)

## 2019-11-25 MED ORDER — METOCLOPRAMIDE HCL 5 MG PO TABS: 5.0000 mg | ORAL_TABLET | Freq: Three times a day (TID) | ORAL | 0 refills | Status: DC | PRN

## 2019-11-25 NOTE — ED Provider Notes (Signed)
[facility] Emergency Department Provider Note ____________________________________________  Time seen: 1605  I have reviewed the triage vital signs and the nursing notes.  HISTORY  Chief Complaint  Possible Pregnancy  HPI Diane Powell is a 33 y.o. female presents with self to the ED for confirmation of pregnancy.  Patient describes she has taken home pregnancy test at home, and has had several of them, 5 to be exact, reported as positive.  She denies any symptoms at this time denies any pain, vaginal bleeding, dysuria, or emesis.  She reports her last normal menstrual period was on 10/13, but does admit to having a short period on 11/15, that lasted about 3 days.  Patient is requesting confirmation pregnancy testing at this time.  Past Medical History:  Diagnosis Date  . Asthma   . Migraines     Patient Active Problem List   Diagnosis Date Noted  . PTSD (post-traumatic stress disorder) 10/16/2018  . Cluster B personality disorder ([place]) 10/16/2018  . Cannabis use disorder, moderate, dependence ([place]) 10/15/2018  . Amphetamine use disorder, severe ([place]) 10/15/2018  . 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 Suicidal ideation 10/15/2018    Past Surgical History:  Procedure Laterality Date  . CYST REMOVAL TRUNK    . TONSILLECTOMY      Prior to Admission medications   Medication Sig Start Date End Date Taking? Authorizing Provider  hydrOXYzine (ATARAX/VISTARIL) 50 MG tablet Take 1 tablet (50 mg total) by mouth 3 (three) times daily as needed for anxiety. 10/16/18   McNew, Tyson Babinski, MD  metoCLOPramide (REGLAN) 5 MG tablet Take 1 tablet (5 mg total) by mouth every 8 (eight) hours as needed for up to 5 days for nausea or vomiting. 11/25/19 11/30/19  Jeanise Durfey, Dannielle Karvonen, PA-C  sertraline (ZOLOFT) 50 MG tablet Take 1 tablet (50 mg total) by mouth daily. 10/16/18 10/16/19  McNew, Tyson Babinski, MD     Allergies Other  Family History  Problem Relation Age of Onset  . Endometriosis Mother   . Diabetes Father     Social History Social History   Tobacco Use  . Smoking status: Current Every Day Smoker    Packs/day: 0.50  . Smokeless tobacco: Never Used  Substance Use Topics  . Alcohol use: No    Alcohol/week: 0.0 standard drinks  . Drug use: Yes    Frequency: 6.0 times per week    Types: Marijuana    Review of Systems  Constitutional: Negative for fever. Eyes: Negative for visual changes. ENT: Negative for sore throat. Cardiovascular: Negative for chest pain. Respiratory: Negative for shortness of breath. Gastrointestinal: Negative for abdominal pain, vomiting and diarrhea. Genitourinary: Negative for dysuria.  Reports amenorrhea Musculoskeletal: Negative for back pain. Skin: Negative for rash. Neurological: Negative for headaches, focal weakness or numbness. ____________________________________________  PHYSICAL EXAM:  VITAL SIGNS: ED Triage Vitals  Enc Vitals Group     BP 11/25/19 1520 (!) 150/92     Pulse Rate 11/25/19 1520 98     Resp 11/25/19 1520 16     Temp 11/25/19 1520 98.5 F (36.9 C)     Temp Source 11/25/19 1520 Oral     SpO2 11/25/19 1520 99 %     Weight 11/25/19 1520 164 lb (74.4 kg)     Height 11/25/19 1520 5\' 5"  (1.651 m)     Head Circumference --      Peak Flow --      Pain Score 11/25/19 1519  0     Pain Loc --      Pain Edu? --      Excl. in [place]? --     Constitutional: Alert and oriented. Well appearing and in no distress. Head: Normocephalic and atraumatic. Eyes: Conjunctivae are normal. Normal extraocular movements Cardiovascular: Normal rate, regular rhythm. Normal distal pulses. Respiratory: Normal respiratory effort. No wheezes/rales/rhonchi. Gastrointestinal: Soft and nontender. No distention. Musculoskeletal: Nontender with normal range of motion in all extremities.  Neurologic:  Normal gait without ataxia. Normal speech  and language. No gross focal neurologic deficits are appreciated. Skin:  Skin is warm, dry and intact. No rash noted. ____________________________________________   LABS (pertinent positives/negatives) Labs Reviewed  HCG, QUANTITATIVE, PREGNANCY - Abnormal; Notable for the following components:      Result Value   hCG, Beta Chain, Quant, S 74 (*)    All other components within normal limits  POC URINE PREG, ED  POCT PREGNANCY, URINE  ____________________________________________  PROCEDURES  Procedures ____________________________________________  INITIAL IMPRESSION / ASSESSMENT AND PLAN / ED COURSE  Patient with secondary amenorrhea and multiple positive home pregnancy tests, presents with confirmation.  Her UPT here was negative but her quant revealed an hCG at 35.  This value is most consistent with the patient's adjusted LMP of 11/15.  Patient at this time is without any complaints.  She is advised to take prescription nausea medicine as needed to help maintain normal caloric intake.  She is also advised to follow with primary provider and establish routine prenatal care.  She should continue to monitor symptoms, return to the ED for any unexpected vaginal bleeding.  Patient is pleased with the results, and verbalized understanding of her discharge instructions.  Diane Powell was evaluated in Emergency Department on 11/25/2019 for the symptoms described in the history of present illness. She was evaluated in the context of the global COVID-19 pandemic, which necessitated consideration that the patient might be at risk for infection with the SARS-CoV-2 virus that causes COVID-19. Institutional protocols and algorithms that pertain to the evaluation of patients at risk for COVID-19 are in a state of rapid change based on information released by regulatory bodies including the CDC and federal and state organizations. These policies and algorithms were followed during the patient's care in  the ED. ____________________________________________  FINAL CLINICAL IMPRESSION(S) / ED DIAGNOSES  Final diagnoses:  Positive pregnancy test      Melvenia Needles, PA-C 11/25/19 2335    Vanessa , MD 11/27/19 [phone]

## 2019-11-25 NOTE — ED Notes (Signed)
See triage note    States she needs confirmation of pregnancy  Denies any vaginal bleeding or cramping

## 2019-11-25 NOTE — Discharge Instructions (Addendum)
You have been confirmed to have a POSITIVE pregnancy test. Your expected delivery date (based on LMP) is 08/08/2020. Follow-up with [facility] for routine prenatal care. Return to the ED for any concerning developments.

## 2019-11-25 NOTE — ED Triage Notes (Signed)
Pt reports she took 5 pregnancy tests at home and they came back positive and she needs confirmation.

## 2019-11-26 ENCOUNTER — Encounter: Payer: Self-pay | Admitting: Emergency Medicine

## 2019-11-26 ENCOUNTER — Emergency Department: Payer: Self-pay

## 2019-11-26 ENCOUNTER — Other Ambulatory Visit: Payer: Self-pay | Admitting: Obstetrics and Gynecology

## 2019-11-26 ENCOUNTER — Telehealth: Payer: Self-pay | Admitting: Advanced Practice Midwife

## 2019-11-26 ENCOUNTER — Other Ambulatory Visit: Payer: Self-pay

## 2019-11-26 ENCOUNTER — Emergency Department
Admission: EM | Admit: 2019-11-26 | Discharge: 2019-11-26 | Disposition: A | Discharge status: Home / Self Care | Payer: Self-pay | Attending: Emergency Medicine | Admitting: Emergency Medicine

## 2019-11-26 DIAGNOSIS — J45909 Unspecified asthma, uncomplicated: Secondary | ICD-10-CM | POA: Insufficient documentation

## 2019-11-26 DIAGNOSIS — Z3A00 Weeks of gestation of pregnancy not specified: Secondary | ICD-10-CM | POA: Insufficient documentation

## 2019-11-26 DIAGNOSIS — O200 Threatened abortion: Secondary | ICD-10-CM | POA: Insufficient documentation

## 2019-11-26 DIAGNOSIS — F17210 Nicotine dependence, cigarettes, uncomplicated: Secondary | ICD-10-CM | POA: Insufficient documentation

## 2019-11-26 DIAGNOSIS — O209 Hemorrhage in early pregnancy, unspecified: Secondary | ICD-10-CM

## 2019-11-26 DIAGNOSIS — F1210 Cannabis abuse, uncomplicated: Secondary | ICD-10-CM | POA: Insufficient documentation

## 2019-11-26 LAB — COMPREHENSIVE METABOLIC PANEL
ALT: 25 U/L (ref 0–44)
AST: 18 U/L (ref 15–41)
Albumin: 4.2 g/dL (ref 3.5–5.0)
Alkaline Phosphatase: 64 U/L (ref 38–126)
Anion gap: 9 (ref 5–15)
BUN: 17 mg/dL (ref 6–20)
CO2: 24 mmol/L (ref 22–32)
Calcium: 9.2 mg/dL (ref 8.9–10.3)
Chloride: 105 mmol/L (ref 98–111)
Creatinine, Ser: 0.7 mg/dL (ref 0.44–1.00)
GFR calc Af Amer: >60 mL/min (ref >60)
GFR calc non Af Amer: >60 mL/min (ref >60)
Glucose, Bld: 117 mg/dL — ABNORMAL HIGH (ref 70–99)
Potassium: 4.1 mmol/L (ref 3.5–5.1)
Sodium: 138 mmol/L (ref 135–145)
Total Bilirubin: 0.5 mg/dL (ref 0.3–1.2)
Total Protein: 7.6 g/dL (ref 6.5–8.1)

## 2019-11-26 LAB — CBC
HCT: 43.3 % (ref 36.0–46.0)
Hemoglobin: 14.5 g/dL (ref 12.0–15.0)
MCH: 29.5 pg (ref 26.0–34.0)
MCHC: 33.5 g/dL (ref 30.0–36.0)
MCV: 88.2 fL (ref 80.0–100.0)
Platelets: 218 10*3/uL (ref 150–400)
RBC: 4.91 MIL/uL (ref 3.87–5.11)
RDW: 13.2 % (ref 11.5–15.5)
WBC: 9.3 10*3/uL (ref 4.0–10.5)
nRBC: 0 % (ref 0.0–0.2)

## 2019-11-26 LAB — URINALYSIS, COMPLETE (UACMP) WITH MICROSCOPIC
Bacteria, UA: NONE SEEN
Bilirubin Urine: NEGATIVE
Glucose, UA: NEGATIVE mg/dL
Ketones, ur: NEGATIVE mg/dL
Leukocytes,Ua: NEGATIVE
Nitrite: NEGATIVE
Protein, ur: NEGATIVE mg/dL
Specific Gravity, Urine: 1.02 (ref 1.005–1.030)
pH: 6 (ref 5.0–8.0)

## 2019-11-26 LAB — ABO/RH: ABO/RH(D): A NEG

## 2019-11-26 LAB — POCT PREGNANCY, URINE: Preg Test, Ur: NEGATIVE

## 2019-11-26 LAB — ANTIBODY SCREEN: Antibody Screen: NEGATIVE

## 2019-11-26 LAB — HCG, QUANTITATIVE, PREGNANCY: hCG, Beta Chain, Quant, S: 54 m[IU]/mL — ABNORMAL HIGH (ref <5)

## 2019-11-26 MED ORDER — RHO D IMMUNE GLOBULIN 1500 UNIT/2ML IJ SOSY
300.0000 ug | PREFILLED_SYRINGE | Freq: Once | INTRAMUSCULAR | Status: AC
  Administered 2019-11-26: 300 ug via INTRAMUSCULAR
  Filled 2019-11-26: qty 2

## 2019-11-26 NOTE — ED Notes (Signed)
Dr. Ellender Hose aware that blood bank has Rhogam injection ready. Awaiting order to give.

## 2019-11-26 NOTE — ED Provider Notes (Signed)
[facility] Emergency Department Provider Note  ____________________________________________   First MD Initiated Contact with Patient 11/26/19 [phone]     (approximate)  I have reviewed the triage vital signs and the nursing notes.   HISTORY  Chief Complaint Vaginal Bleeding and Abdominal Cramping    HPI Diane Powell is a 33 y.o. female  Here with vaginal bleeding. Pt reports that she just found out she is pregnant. Was in the ED yesterday. She reports that overnight she began spotting. She reports a small amount of light red spotting. No clots or passage of tissue. She has had no associated abd or pelvic pain. No urinary sx. She is not on blood thinners. G2, h/o SAB at 15 wk from first pregnancy. No other complaints.        Past Medical History:  Diagnosis Date  . Asthma   . Migraines     Patient Active Problem List   Diagnosis Date Noted  . PTSD (post-traumatic stress disorder) 10/16/2018  . Cluster B personality disorder ([place]) 10/16/2018  . Cannabis use disorder, moderate, dependence ([place]) 10/15/2018  . Amphetamine use disorder, severe ([place]) 10/15/2018  . Major depressive disorder, recurrent severe without psychotic features ([place]) 10/15/2018  . Suicidal ideation 10/15/2018    Past Surgical History:  Procedure Laterality Date  . CYST REMOVAL TRUNK    . TONSILLECTOMY      Prior to Admission medications   Medication Sig Start Date End Date Taking? Authorizing Provider  hydrOXYzine (ATARAX/VISTARIL) 50 MG tablet Take 1 tablet (50 mg total) by mouth 3 (three) times daily as needed for anxiety. 10/16/18   McNew, Tyson Babinski, MD  metoCLOPramide (REGLAN) 5 MG tablet Take 1 tablet (5 mg total) by mouth every 8 (eight) hours as needed for up to 5 days for nausea or vomiting. 11/25/19 11/30/19  Menshew, Dannielle Karvonen, PA-C  sertraline (ZOLOFT) 50 MG tablet Take 1 tablet (50 mg total) by mouth daily. 10/16/18 10/16/19  McNew, Tyson Babinski, MD    Allergies  Other  Family History  Problem Relation Age of Onset  . Endometriosis Mother   . Diabetes Father     Social History Social History   Tobacco Use  . Smoking status: Current Every Day Smoker    Packs/day: 0.50  . Smokeless tobacco: Never Used  Substance Use Topics  . Alcohol use: No    Alcohol/week: 0.0 standard drinks  . Drug use: Yes    Frequency: 6.0 times per week    Types: Marijuana    Review of Systems  Review of Systems  Constitutional: Negative for chills and fever.  HENT: Negative for sore throat.   Respiratory: Negative for shortness of breath.   Cardiovascular: Negative for chest pain.  Gastrointestinal: Negative for abdominal pain.  Genitourinary: Positive for vaginal bleeding. Negative for flank pain, vaginal discharge and vaginal pain.  Musculoskeletal: Negative for neck pain.  Skin: Negative for rash and wound.  Allergic/Immunologic: Negative for immunocompromised state.  Neurological: Negative for weakness and numbness.  Hematological: Does not bruise/bleed easily.     ____________________________________________  PHYSICAL EXAM:      VITAL SIGNS: ED Triage Vitals  Enc Vitals Group     BP 11/26/19 0852 132/83     Pulse Rate 11/26/19 0852 86     Resp 11/26/19 0903 18     Temp 11/26/19 0852 98.5 F (36.9 C)     Temp Source 11/26/19 0852 Oral     SpO2 11/26/19 0852 99 %  Weight 11/26/19 0840 164 lb (74.4 kg)     Height 11/26/19 0840 5\' 5"  (1.651 m)     Head Circumference --      Peak Flow --      Pain Score 11/26/19 0840 2     Pain Loc --      Pain Edu? --      Excl. in [place]? --      Physical Exam Vitals signs and nursing note reviewed.  Constitutional:      General: She is not in acute distress.    Appearance: She is well-developed.  HENT:     Head: Normocephalic and atraumatic.  Eyes:     Conjunctiva/sclera: Conjunctivae normal.  Neck:     Musculoskeletal: Neck supple.  Cardiovascular:     Rate and Rhythm: Normal rate and  regular rhythm.     Heart sounds: Normal heart sounds. No murmur. No friction rub.  Pulmonary:     Effort: Pulmonary effort is normal. No respiratory distress.     Breath sounds: Normal breath sounds. No wheezing or rales.  Abdominal:     General: There is no distension.     Palpations: Abdomen is soft.     Tenderness: There is no abdominal tenderness.     Comments: Soft, non-tender, uterus not enlarged, no guarding or rebound  Skin:    General: Skin is warm.     Capillary Refill: Capillary refill takes less than 2 seconds.  Neurological:     Mental Status: She is alert and oriented to person, place, and time.     Motor: No abnormal muscle tone.       ____________________________________________   LABS (all labs ordered are listed, but only abnormal results are displayed)  Labs Reviewed  COMPREHENSIVE METABOLIC PANEL - Abnormal; Notable for the following components:      Result Value   Glucose, Bld 117 (*)    All other components within normal limits  HCG, QUANTITATIVE, PREGNANCY - Abnormal; Notable for the following components:   hCG, Beta Chain, Quant, S 54 (*)    All other components within normal limits  URINALYSIS, COMPLETE (UACMP) WITH MICROSCOPIC - Abnormal; Notable for the following components:   Color, Urine YELLOW (*)    APPearance HAZY (*)    Hgb urine dipstick SMALL (*)    All other components within normal limits  CBC  POCT PREGNANCY, URINE  ABO/RH  ANTIBODY SCREEN  RHOGAM INJECTION    ____________________________________________  EKG: None ________________________________________  RADIOLOGY All imaging, including plain films, CT scans, and ultrasounds, independently reviewed by me, and interpretations confirmed via formal radiology reads.  ED MD interpretation:   U/S: No intrauterine or ectopic pregnancy  Official radiology report(s): US Ob Less Than 14 Weeks With Ob Transvaginal  Result Date: 11/26/2019 CLINICAL DATA:  Vaginal bleeding.  Negative urine pregnancy test. Decreasing beta HCG. EXAM: OBSTETRIC <14 WK [place] AND TRANSVAGINAL OB US TECHNIQUE: Both transabdominal and transvaginal ultrasound examinations were performed for complete evaluation of the gestation as well as the maternal uterus, adnexal regions, and pelvic cul-de-sac. Transvaginal technique was performed to assess early pregnancy. COMPARISON:  None. FINDINGS: Intrauterine gestational sac: None Maternal uterus/adnexae: Unremarkable appearance of the uterus aside from nabothian cysts. 6 mm endometrial thickness. Prominent number of follicles in both ovaries. Possible 16 mm corpus luteum in the left ovary. No adnexal mass or pelvic free fluid. IMPRESSION: No intrauterine or ectopic pregnancy identified. Electronically Signed   By: Logan Bores M.D.   On: 11/26/2019  11:31    ____________________________________________  PROCEDURES   Procedure(s) performed (including Critical Care):  Procedures  ____________________________________________  INITIAL IMPRESSION / MDM / [place] / ED COURSE  As part of my medical decision making, I reviewed the following data within the [facility] notes reviewed and incorporated, Old chart reviewed, Notes from prior ED visits, and [place] Controlled Substance Database       *Risako Strommer was evaluated in Emergency Department on 11/26/2019 for the symptoms described in the history of present illness. She was evaluated in the context of the global COVID-19 pandemic, which necessitated consideration that the patient might be at risk for infection with the SARS-CoV-2 virus that causes COVID-19. Institutional protocols and algorithms that pertain to the evaluation of patients at risk for COVID-19 are in a state of rapid change based on information released by regulatory bodies including the CDC and federal and state organizations. These policies and algorithms were followed during the patient's care in the ED.   Some ED evaluations and interventions may be delayed as a result of limited staffing during the pandemic.*     Medical Decision Making:  33 yo F here with vaginal spotting. Estimated [redacted] wk GA. Unfortunately, suspect possible threatened vs completed AB based on downtrending HCG with normal U/S. Low suspicion for ectopic based on no adnexal pain, normal ovaries and no free fluid in cul-de-sac. Labs o/w reassuring, Hgb stable. She is RH neg so was given Rhogam. Discussed differential with pt. Will refer her to [facility] for repeat quant to continue trending. Resources provided.  ____________________________________________  FINAL CLINICAL IMPRESSION(S) / ED DIAGNOSES  Final diagnoses:  Threatened miscarriage     MEDICATIONS GIVEN DURING THIS VISIT:  Medications  rho (d) immune globulin (RHIG/RHOPHYLAC) injection 300 mcg (300 mcg Intramuscular Given 11/26/19 1427)     ED Discharge Orders    None       Note:  This document was prepared using Dragon voice recognition software and may include unintentional dictation errors.   Duffy Bruce, MD 11/26/19 1447

## 2019-11-26 NOTE — Discharge Instructions (Addendum)
Call one of the numbers above to set up a follow-up in 48 hours for repeat checkup and hormone level check.

## 2019-11-26 NOTE — ED Notes (Signed)
Pt in ultrasound

## 2019-11-26 NOTE — Telephone Encounter (Signed)
She should follow up on Friday for a GYN visit and labs, whoever sees her can place the order for the labs during her visit.

## 2019-11-26 NOTE — Telephone Encounter (Signed)
Patient reports she was seen in ER today for threaten miscarriage and advise to follow up with [place] for HCG levels. Please order and advise

## 2019-11-26 NOTE — ED Notes (Signed)
Reports bilateral hip pain that light pink vaginal bleeding that started this AM-pt reports she is approx [redacted] weeks pregnant and found out yesterday.

## 2019-11-26 NOTE — ED Triage Notes (Signed)
Pt reports came in yesterday to find out if she was pregnant and she was and today she started with light bleeding and some abd pain.

## 2019-11-26 NOTE — ED Notes (Signed)
Pt stable. A/Ox4. Pt ambulatory independently out of room with a steady gait. Denies pain at this time.

## 2019-11-27 LAB — RHOGAM INJECTION: Unit division: 0

## 2019-11-27 NOTE — Telephone Encounter (Signed)
Called and left voicemail for patient to call back to confirm appointment

## 2019-11-28 ENCOUNTER — Encounter: Payer: Self-pay | Admitting: Certified Nurse Midwife

## 2019-11-28 NOTE — Telephone Encounter (Signed)
Patient is schedule for 11/28/19 with CLG

## 2019-12-19 NOTE — L&D Delivery Note (Signed)
Delivery Note  Date of delivery: 07/06/2020 Estimated Date of Delivery: 11/30/20 Patient's last menstrual period was 02/16/2020 (approximate). EGA: [redacted]w[redacted]d  First Stage: Induction: misoprostol Analgesia Lorriane Shire intrapartum: epidural  Scot Dock presented to L&D after decreased fetal heart tones at home. IUFD was diagnosed. She was induced with misoprostol x 3 doses.   Second & Third Stage: Delivery at 0855 on 07/06/2020  She called out to state that she felt pressure. By the time the RN got to the bedside, the baby had been delivered and was in the bed. She had a spontaneous vaginal birth of a stillborn female. Placenta was found to be in the vagina, so patient was asked to push. She pushed and the placenta delivered slowly, tattered. Cervical exam after delivery of the placenta was suspicious for retained placenta. Unable to remove this piece with cervical sweep. Dr. Leafy Ro called to the bedside to assess. Please see her note for further detail on removal of placental fragments.    Uterine tone firm / bleeding scant No laceration identified  Anesthesia for repair: n/a Est. Blood Loss (mL): 539  Complications: IUFD  Newborn:  Apgar Scores: 0, 0   Diane Powell, [place] 07/06/2020 9:14 AM

## 2020-03-28 ENCOUNTER — Encounter (HOSPITAL_COMMUNITY): Payer: Self-pay | Admitting: Emergency Medicine

## 2020-03-28 ENCOUNTER — Emergency Department (HOSPITAL_COMMUNITY)
Admission: EM | Admit: 2020-03-28 | Discharge: 2020-03-28 | Disposition: A | Discharge status: Home / Self Care | Payer: Medicaid Other | Attending: Emergency Medicine | Admitting: Emergency Medicine

## 2020-03-28 ENCOUNTER — Other Ambulatory Visit: Payer: Self-pay

## 2020-03-28 DIAGNOSIS — F17210 Nicotine dependence, cigarettes, uncomplicated: Secondary | ICD-10-CM | POA: Insufficient documentation

## 2020-03-28 DIAGNOSIS — Z3A01 Less than 8 weeks gestation of pregnancy: Secondary | ICD-10-CM | POA: Diagnosis not present

## 2020-03-28 DIAGNOSIS — Z3201 Encounter for pregnancy test, result positive: Secondary | ICD-10-CM | POA: Insufficient documentation

## 2020-03-28 DIAGNOSIS — Z79899 Other long term (current) drug therapy: Secondary | ICD-10-CM | POA: Insufficient documentation

## 2020-03-28 HISTORY — DX: Complete or unspecified spontaneous abortion without complication: O03.9

## 2020-03-28 LAB — POC URINE PREG, ED: Preg Test, Ur: POSITIVE — AB

## 2020-03-28 NOTE — ED Triage Notes (Signed)
Pt reports home pregnancy test positive   Called the [facility] clinic who advised to come to ED rather than office  For Official preg test as pt has no insurance currently  Sot hat she might qualify for [employer]

## 2020-03-28 NOTE — Discharge Instructions (Addendum)
You are pregnant as confirmed by our lab test today.  Continue taking your prenatal vitamins and plan follow up care for ongoing prenatal care as discussed.

## 2020-03-28 NOTE — ED Provider Notes (Signed)
[facility] EMERGENCY DEPARTMENT Provider Note   CSN: [id number] Arrival date & time: 03/28/20  1059     History Chief Complaint  Patient presents with  . pregnancy test to qualify for medicaid    Diane Powell is a 34 y.o. female presenting for confirmation of pregnancy.  She is a G2 P0 female reporting having had a spontaneous miscarriage 4 months ago.  Her LMP was 02-16-20 and reports having a normal 5-day period with regular flow, was late this month and in addition has noticed increasing breast tenderness.  She took a home pregnancy test which was positive.  She is here essentially for confirmation of this finding so that she can apply for Medicaid.  She denies abdominal pain, has no vaginal discharge, spotting or bleeding.  She does endorse low pelvic bilateral ovarian cramping which is associated with her chronic endometriosis and is not new or different from her chronic endometriosis symptoms.  She has been actively trying to get pregnant and is currently on prenatal vitamins.  She denies dysuria or hematuria, no other complaints.  HPI     Past Medical History:  Diagnosis Date  . Asthma   . Migraines   . Miscarriage     Patient Active Problem List   Diagnosis Date Noted  . PTSD (post-traumatic stress disorder) 10/16/2018  . Cluster B personality disorder ([place]) 10/16/2018  . Cannabis use disorder, moderate, dependence ([place]) 10/15/2018  . 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 Suicidal ideation 10/15/2018    Past Surgical History:  Procedure Laterality Date  . CYST REMOVAL TRUNK    . TONSILLECTOMY       OB History   No obstetric history on file.     Family History  Problem Relation Age of Onset  . Endometriosis Mother   . Diabetes Father     Social History   Tobacco Use  . Smoking status: Current Every Day Smoker    Packs/day: 0.25  . Smokeless  tobacco: Never Used  Substance Use Topics  . Alcohol use: No    Alcohol/week: 0.0 standard drinks  . Drug use: Yes    Frequency: 6.0 times per week    Types: Marijuana    Home Medications Prior to Admission medications   Medication Sig Start Date End Date Taking? Authorizing Provider  hydrOXYzine (ATARAX/VISTARIL) 50 MG tablet Take 1 tablet (50 mg total) by mouth 3 (three) times daily as needed for anxiety. 10/16/18   McNew, Tyson Babinski, MD  metoCLOPramide (REGLAN) 5 MG tablet Take 1 tablet (5 mg total) by mouth every 8 (eight) hours as needed for up to 5 days for nausea or vomiting. 11/25/19 11/30/19  Menshew, Dannielle Karvonen, PA-C  sertraline (ZOLOFT) 50 MG tablet Take 1 tablet (50 mg total) by mouth daily. 10/16/18 10/16/19  McNew, Tyson Babinski, MD    Allergies    Other  Review of Systems   Review of Systems  Constitutional: Negative.   HENT: Negative.   Respiratory: Negative.   Cardiovascular: Negative.   Gastrointestinal: Negative for abdominal pain, nausea and vomiting.  Genitourinary: Negative.  Negative for vaginal bleeding and vaginal discharge.       Negative except as mentioned in HPI.   Musculoskeletal: Negative for arthralgias, joint swelling and neck pain.  Skin: Negative.   Neurological: Negative.   Psychiatric/Behavioral: Negative.     Physical Exam Updated Vital Signs BP 123/67 (BP Location: Right Arm)  Pulse 95   Temp 98.4 F (36.9 C) (Oral)   Resp 16   Ht 5\' 5"  (1.651 m)   Wt 74.8 kg   LMP 02/16/2020 (Approximate)   SpO2 100%   BMI 27.46 kg/m   Physical Exam Vitals and nursing note reviewed.  Constitutional:      Appearance: She is well-developed.  HENT:     Head: Normocephalic and atraumatic.  Cardiovascular:     Rate and Rhythm: Normal rate.  Pulmonary:     Effort: Pulmonary effort is normal.  Abdominal:     General: There is no distension.     Tenderness: There is no abdominal tenderness.  Genitourinary:    Comments: Deferred  Musculoskeletal:        General: Normal range of motion.  Skin:    General: Skin is warm and dry.  Neurological:     Mental Status: She is alert.     ED Results / Procedures / Treatments   Labs (all labs ordered are listed, but only abnormal results are displayed) Labs Reviewed  POC URINE PREG, ED - Abnormal; Notable for the following components:      Result Value   Preg Test, Ur POSITIVE (*)    All other components within normal limits    EKG None  Radiology No results found.  Procedures Procedures (including critical care time)  Medications Ordered in ED Medications - No data to display  ED Course  I have reviewed the triage vital signs and the nursing notes.  Pertinent labs & imaging results that were available during my care of the patient were reviewed by me and considered in my medical decision making (see chart for details).    MDM Rules/Calculators/A&P                      Patient is essentially here for confirmation of pregnancy.  She has no significant physical complaints at this time.  Her LMP was 02/16/2020 suggesting a gestation of 4 weeks or less.  She does have both primary and GYN care in [place].  She is currently on prenatal vitamins which she will continue.  Encouraged follow-up with her provider for further prenatal care. Final Clinical Impression(s) / ED Diagnoses Final diagnoses:  Less than [redacted] weeks gestation of pregnancy    Rx / DC Orders ED Discharge Orders    None       Landis Martins 03/28/20 1629    Fredia Sorrow, MD 03/31/20 1718

## 2020-03-31 ENCOUNTER — Emergency Department (HOSPITAL_COMMUNITY): Payer: Medicaid Other

## 2020-03-31 ENCOUNTER — Encounter (HOSPITAL_COMMUNITY): Payer: Self-pay

## 2020-03-31 ENCOUNTER — Emergency Department (HOSPITAL_COMMUNITY)
Admission: EM | Admit: 2020-03-31 | Discharge: 2020-03-31 | Disposition: A | Discharge status: Home / Self Care | Payer: Medicaid Other | Attending: Emergency Medicine | Admitting: Emergency Medicine

## 2020-03-31 ENCOUNTER — Other Ambulatory Visit: Payer: Self-pay

## 2020-03-31 DIAGNOSIS — O99321 Drug use complicating pregnancy, first trimester: Secondary | ICD-10-CM | POA: Insufficient documentation

## 2020-03-31 DIAGNOSIS — F17210 Nicotine dependence, cigarettes, uncomplicated: Secondary | ICD-10-CM | POA: Diagnosis not present

## 2020-03-31 DIAGNOSIS — Z3A01 Less than 8 weeks gestation of pregnancy: Secondary | ICD-10-CM | POA: Insufficient documentation

## 2020-03-31 DIAGNOSIS — F1210 Cannabis abuse, uncomplicated: Secondary | ICD-10-CM | POA: Diagnosis not present

## 2020-03-31 DIAGNOSIS — Z8759 Personal history of other complications of pregnancy, childbirth and the puerperium: Secondary | ICD-10-CM | POA: Diagnosis not present

## 2020-03-31 DIAGNOSIS — Z3491 Encounter for supervision of normal pregnancy, unspecified, first trimester: Secondary | ICD-10-CM | POA: Diagnosis not present

## 2020-03-31 DIAGNOSIS — O26891 Other specified pregnancy related conditions, first trimester: Secondary | ICD-10-CM | POA: Insufficient documentation

## 2020-03-31 DIAGNOSIS — J45909 Unspecified asthma, uncomplicated: Secondary | ICD-10-CM | POA: Insufficient documentation

## 2020-03-31 DIAGNOSIS — R102 Pelvic and perineal pain: Secondary | ICD-10-CM | POA: Insufficient documentation

## 2020-03-31 DIAGNOSIS — O99331 Smoking (tobacco) complicating pregnancy, first trimester: Secondary | ICD-10-CM | POA: Diagnosis not present

## 2020-03-31 LAB — HCG, QUANTITATIVE, PREGNANCY: hCG, Beta Chain, Quant, S: 786 m[IU]/mL — ABNORMAL HIGH (ref <5)

## 2020-03-31 NOTE — ED Provider Notes (Signed)
[facility] EMERGENCY DEPARTMENT Provider Note   CSN: [id number] Arrival date & time: 03/31/20  1840     History Chief Complaint  Patient presents with  . Abdominal Cramping  . Routine Prenatal Visit    Diane Powell is a 34 y.o. G8P0020 female with history of polysubstance abuse who presents with pelvic pain. She states that she is having pelvic cramping over the bilateral pelvis that is chronic in nature. It comes and goes randomly and feels like her ovaries are bursting. She thinks it's related to endometriosis but since she does not have insurance or an OBGYN and she has had 2 prior miscarriages she wanted to come get checked out today. She was here in the ED three days ago for confirmation of pregnancy and it was noted she reported cramping at that time as well. She states her LMP was 02/21/20 so it's estimated she is 5 weeks and 4 days. She denies vaginal bleeding or discharge. No urinary symptoms. She has not been taking anything for pain.  HPI     Past Medical History:  Diagnosis Date  . Asthma   . Migraines   . Miscarriage     Patient Active Problem List   Diagnosis Date Noted  . PTSD (post-traumatic stress disorder) 10/16/2018  . Cluster B personality disorder ([place]) 10/16/2018  . Cannabis use disorder, moderate, dependence ([place]) 10/15/2018  . Amphetamine use disorder, severe ([place]) 10/15/2018  . Major depressive disorder, recurrent severe without psychotic features ([place]) 10/15/2018  . Suicidal ideation 10/15/2018    Past Surgical History:  Procedure Laterality Date  . CYST REMOVAL TRUNK    . TONSILLECTOMY       OB History    Gravida  1   Para      Term      Preterm      AB      Living        SAB      TAB      Ectopic      Multiple      Live Births              Family History  Problem Relation Age of Onset  . Endometriosis Mother   . Diabetes Father     Social History   Tobacco Use  . Smoking status: Current Every Day Smoker    Packs/day: 0.25  . Smokeless tobacco: Never Used  Substance Use Topics  . Alcohol use: No    Alcohol/week: 0.0 standard drinks  . Drug use: Yes    Frequency: 6.0 times per week    Types: Marijuana    Home Medications Prior to Admission medications   Medication Sig Start Date End Date Taking? Authorizing Provider  hydrOXYzine (ATARAX/VISTARIL) 50 MG tablet Take 1 tablet (50 mg total) by mouth 3 (three) times daily as needed for anxiety. 10/16/18   McNew, Tyson Babinski, MD  metoCLOPramide (REGLAN) 5 MG tablet Take 1 tablet (5 mg total) by mouth every 8 (eight) hours as needed for up to 5 days for nausea or vomiting. 11/25/19 11/30/19  Menshew, Dannielle Karvonen, PA-C  sertraline (ZOLOFT) 50 MG tablet Take 1 tablet (50 mg total) by mouth daily. 10/16/18 10/16/19  McNew, Tyson Babinski, MD    Allergies    Other  Review of Systems   Review of Systems  Constitutional: Negative for fever.  Gastrointestinal: Negative for nausea and vomiting.  Genitourinary: Positive for pelvic pain. Negative for dysuria, vaginal bleeding and vaginal discharge.  Physical Exam Updated Vital Signs BP 128/67 (BP Location: Right Arm)   Pulse (!) 104   Temp 98.4 F (36.9 C) (Tympanic)   Resp 16   Ht 5\' 5"  (1.651 m)   Wt 74.8 kg   LMP 02/16/2020 (Approximate)   SpO2 98%   BMI 27.46 kg/m   Physical Exam Vitals and nursing note reviewed.  Constitutional:      General: She is not in acute distress.    Appearance: Normal appearance. She is well-developed. She is not ill-appearing.  HENT:     Head: Normocephalic and atraumatic.  Eyes:     General: No scleral icterus.       Right eye: No discharge.        Left eye: No discharge.     Conjunctiva/sclera: Conjunctivae normal.     Pupils: Pupils are equal, round, and reactive to light.  Cardiovascular:     Rate and Rhythm: Normal rate and regular rhythm.  Pulmonary:     Effort: Pulmonary effort is normal. No respiratory distress.     Breath sounds: Normal breath  sounds.  Abdominal:     General: There is no distension.     Palpations: Abdomen is soft.     Tenderness: There is no abdominal tenderness.  Musculoskeletal:     Cervical back: Normal range of motion.  Skin:    General: Skin is warm and dry.  Neurological:     Mental Status: She is alert and oriented to person, place, and time.  Psychiatric:        Behavior: Behavior normal.     ED Results / Procedures / Treatments   Labs (all labs ordered are listed, but only abnormal results are displayed) Labs Reviewed  HCG, QUANTITATIVE, PREGNANCY - Abnormal; Notable for the following components:      Result Value   hCG, Beta Chain, Quant, S 786 (*)    All other components within normal limits    EKG None  Radiology No results found.  Procedures Procedures (including critical care time)  Medications Ordered in ED Medications - No data to display  ED Course  I have reviewed the triage vital signs and the nursing notes.  Pertinent labs & imaging results that were available during my care of the patient were reviewed by me and considered in my medical decision making (see chart for details).  34 year old female with positive pregnancy test who is estimated to be about 5 weeks and 4 days presents with diffuse pelvic cramping.  This seems to be a chronic issue for her and she feels like it is related to endometriosis.  She is just concerned because she has had 2 prior miscarriages.  Beta hCG was obtained and is 784.  Pelvic and transvaginal ultrasound was ordered to further assess and rule out ectopic.  Patient does not have any vaginal bleeding or discharge and therefore pelvic exam was deferred.  At shift change [place] is still pending - care signed out to K Albrizze PA-C who will dispo. Anticipate d/c with OBGYN for serial quant levels  MDM Rules/Calculators/A&P                       Final Clinical Impression(s) / ED Diagnoses Final diagnoses:  Pelvic pain    Rx / DC Orders ED  Discharge Orders    None       Recardo Evangelist, PA-C 03/31/20 2157    Fredia Sorrow, MD 04/07/20 1041

## 2020-03-31 NOTE — Discharge Instructions (Addendum)
You need to follow-up in 48 hours to have your quant retested.  This is a blood work that you had performed today in the emergency department. Today your quant was 49  You can call Dr. Glo Herring here in [place] to try to schedule an appointment.  If you are unable to get an appointment you can follow-up with the [facility] outpatient clinic in [place].  I have given you the address and phone numbers for both offices.  Return to the emergency department for any new or worsening symptoms including fever, chills, worsening abdominal pain, vaginal bleeding.

## 2020-03-31 NOTE — ED Triage Notes (Addendum)
Pt states she was told Sunday that she is pregnant via urine test at [facility] ED.   Pt c/o cramping in bilateral lower quadrants. Describes pain as being in ovaries and feels like endometriosis.   Pt unsure of gestation or due date. States she has no followup appt with OB due to "waiting for insurance to kick in".

## 2020-03-31 NOTE — ED Provider Notes (Signed)
Care assumed from [place] PA-C at shift change pending transvaginal US.  See her note for full H&P.   Briefly this is 34 yo female presenting to emergency department today with chief complaint of pelvic pain, no vaginal bleeding or discharge. No abdominal pain on exam.   She was seen in ED x 3 days ago with positive pregnancy test. She is G3P0020. Plan is to follow up on [place] and disposition accordingly.   [place] pending at time of sign out.  Physical Exam  BP 128/67 (BP Location: Right Arm)   Pulse (!) 104   Temp 98.4 F (36.9 C) (Tympanic)   Resp 16   Ht 5\' 5"  (1.651 m)   Wt 74.8 kg   LMP 02/16/2020 (Approximate)   SpO2 98%   BMI 27.46 kg/m   Physical Exam  PE: Constitutional: well-developed, well-nourished, no apparent distress HENT: normocephalic, atraumatic. no cervical adenopathy Cardiovascular: normal rate and rhythm, distal pulses intact Pulmonary/Chest: effort normal; breath sounds clear and equal bilaterally; no wheezes or rales Abdominal: soft and non tender. No peritoneal signs. No CVA tenderness. Musculoskeletal: full ROM, no edema Neurological: alert with goal directed thinking Skin: warm and dry, no rash, no diaphoresis Psychiatric: normal mood and affect, normal behavior    ED Course/Procedures    Results for orders placed or performed during the hospital encounter of 03/31/20 (from the past 24 hour(s))  hCG, quantitative, pregnancy     Status: Abnormal   Collection Time: 03/31/20  7:43 PM  Result Value Ref Range   hCG, Beta Chain, Quant, S 786 (H) <5 mIU/mL   Vitals:   03/31/20 1902 03/31/20 2242 03/31/20 2243  BP: 128/67 116/64   Pulse: (!) 104  71  Resp: 16    Temp: 98.4 F (36.9 C)    TempSrc: Tympanic    SpO2: 98%  98%  Weight: 74.8 kg    Height: 5\' 5"  (1.651 m)     OBSTETRIC <14 WK [place] AND TRANSVAGINAL OB US    TECHNIQUE:  Both transabdominal and transvaginal ultrasound examinations were  performed for complete evaluation of the gestation as  well as the  maternal uterus, adnexal regions, and pelvic cul-de-sac.  Transvaginal technique was performed to assess early pregnancy.    COMPARISON: None.    FINDINGS:  Intrauterine gestational sac: None    Yolk sac: Not Visualized.    Embryo: Not Visualized.    Maternal uterus/adnexae: Right ovary measures 2.9 x 4.2 x 1.6 cm and  contains multiple follicles. The left ovary measures 3.3 by 4.3 x  3.9 cm. Anechoic cyst in the left ovary measuring 3.1 cm. This  probably represents a corpus luteum. Small to moderate free fluid  within both adnexa adjacent to the ovaries.    IMPRESSION:  1. No IUP identified. Findings consistent with pregnancy of unknown  location, differential of which includes IUP too early to visualize,  occult ectopic pregnancy, and recent failed pregnancy. Trending of  HCG is advised with follow-up ultrasound as indicated  2. Small moderate bilateral adnexal fluid adjacent to both ovaries.      Electronically Signed  By: Donavan Foil M.D.  On: 03/31/2020 22:22     MDM  Please see previous provider note to include MDM.  Work-up by previous provider included hCG quant.  Result today 786.  Patient estimates she is 5 weeks and 4 days based on LMP, quant is expected to be higher if the dates are correct.  US shows no IUP. I updated patient  on results. Her exam is unremarkable. No abdominal tenderness. Her tachycardia noted in triage has resolved. Heart rate is in the 80s during my exam. Patient understands she will need repeat quant in 48 hours. I have given her information for [facility] or the women's clinic in [place] for repeat [place].  It is reassuring that patient is afebrile and normotensive. The patient appears reasonably screened and/or stabilized for discharge and I doubt any other medical condition or other [facility] requiring further screening, evaluation, or treatment in the ED at this time prior to discharge. The patient is safe for  discharge with strict return precautions discussed.   Portions of this note were generated with [employer]. Dictation errors may occur despite best attempts at proofreading.   Flint Melter 03/31/20 2300    Fredia Sorrow, MD 04/07/20 1043

## 2020-04-02 ENCOUNTER — Other Ambulatory Visit: Payer: Self-pay

## 2020-04-02 ENCOUNTER — Ambulatory Visit (INDEPENDENT_AMBULATORY_CARE_PROVIDER_SITE_OTHER): Payer: Self-pay

## 2020-04-02 DIAGNOSIS — O3680X0 Pregnancy with inconclusive fetal viability, not applicable or unspecified: Secondary | ICD-10-CM

## 2020-04-02 LAB — BETA HCG QUANT (REF LAB): hCG Quant: 1277 m[IU]/mL

## 2020-04-02 NOTE — Progress Notes (Signed)
Chart reviewed for nurse visit. Agree with plan of care.   Lendora Keys N, PA-C 04/02/2020 1:43 PM   

## 2020-04-02 NOTE — Progress Notes (Signed)
Pt here today for STAT Beta Lab for pregnancy of unknown location.  Pt reports that she is having mild pain in the lower abdomen but she states that she has endometriosis.  Pt denies any bleeding.  Pt advised that it will take approximately two hours to get results in which I will call her.   Received notification from [place] that pt's beta results are 1277.    Notified Kerry Hough, PA who recommends that pt have an OB US scheduled in 7-10 days due to appropriate increase in beta level.   OB US scheduled for April 26th @ 1245.  Notified pt provider's recommendation, OB US appt, and that she will come over to the office for results of [place].  I also advised pt that if she has any intense pain or start to bleed like a period to please go to [facility].  Pt verbalized understanding.  Mel Almond, RN  04/02/20

## 2020-04-12 ENCOUNTER — Ambulatory Visit (HOSPITAL_COMMUNITY)
Admission: RE | Admit: 2020-04-12 | Discharge: 2020-04-12 | Disposition: A | Discharge status: Home / Self Care | Payer: Medicaid Other | Source: Ambulatory Visit | Attending: Medical | Admitting: Medical

## 2020-04-12 ENCOUNTER — Other Ambulatory Visit: Payer: Self-pay

## 2020-04-12 ENCOUNTER — Ambulatory Visit (INDEPENDENT_AMBULATORY_CARE_PROVIDER_SITE_OTHER): Payer: Self-pay

## 2020-04-12 DIAGNOSIS — O3680X0 Pregnancy with inconclusive fetal viability, not applicable or unspecified: Secondary | ICD-10-CM | POA: Insufficient documentation

## 2020-04-12 DIAGNOSIS — O3680X1 Pregnancy with inconclusive fetal viability, fetus 1: Secondary | ICD-10-CM

## 2020-04-12 NOTE — Progress Notes (Signed)
Pt here today for OB US results s/p pregnancy of unknown location.  Notified Loma Boston, DO pt's results.  Provider recommendation to have [place] scheduled in 11-14 days to ensure progression of pregnancy.  Pt notified of provider's recommendation.  OB US scheduled for May 10th @ 0800.  Pt also informed she will come to the office as she did today for results after [place] appt.  Pt verbalized understanding.   Mel Almond, RN  04/12/20

## 2020-04-12 NOTE — Progress Notes (Signed)
Chart reviewed - agree with CMA/RN documentation.  ° °

## 2020-04-26 ENCOUNTER — Other Ambulatory Visit: Payer: Self-pay

## 2020-04-26 ENCOUNTER — Ambulatory Visit (HOSPITAL_COMMUNITY)
Admission: RE | Admit: 2020-04-26 | Discharge: 2020-04-26 | Disposition: A | Discharge status: Home / Self Care | Payer: Medicaid Other | Source: Ambulatory Visit | Attending: Family Medicine | Admitting: Family Medicine

## 2020-04-26 ENCOUNTER — Ambulatory Visit (INDEPENDENT_AMBULATORY_CARE_PROVIDER_SITE_OTHER): Payer: Medicaid Other

## 2020-04-26 ENCOUNTER — Encounter: Payer: Self-pay | Admitting: Obstetrics & Gynecology

## 2020-04-26 DIAGNOSIS — O3680X1 Pregnancy with inconclusive fetal viability, fetus 1: Secondary | ICD-10-CM | POA: Diagnosis present

## 2020-04-26 NOTE — Progress Notes (Signed)
Pt here today for pregnancy ultrasound results to determine appropriate progression of pregnancy.  Reviewed results with Dr. Harolyn Rutherford who recommends pt start [facility] and [facility]. Notified pt that she has a good pregnancy EDD 12/05/20, 8w 1d today, and FHR 159. Medications/allergies reviewed.  List of medications safe to take in pregnancy provided.  [place] office to provide proof of pregnancy letter to start OB care.  Pt verbalized understanding with no further questions.   Mel Almond, RN  04/26/20

## 2020-04-27 NOTE — Progress Notes (Signed)
Patient seen and assessed by nursing staff during this encounter. I have reviewed the chart and agree with the documentation and plan.  Verita Schneiders, MD 04/27/2020 10:57 AM

## 2020-04-28 ENCOUNTER — Telehealth: Payer: Self-pay

## 2020-04-28 NOTE — Telephone Encounter (Signed)
Pt called Nurse line requesting for [place] to fax a Proof of Pregnanacy letter to her new GYN office, Pt called today at 9:10am.

## 2020-04-28 NOTE — Telephone Encounter (Signed)
Called pt back regarding Proof of Pregnancy form being faxed to new GYN, advised that she will need to come in office to sign ROI form, Pt states that she can come in office tomorrow 04/29/20 to sign.

## 2020-07-05 ENCOUNTER — Emergency Department: Payer: Medicaid Other

## 2020-07-05 ENCOUNTER — Observation Stay
Admission: EM | Admit: 2020-07-05 | Discharge: 2020-07-06 | Disposition: A | Discharge status: Home / Self Care | Payer: Medicaid Other | Attending: Obstetrics and Gynecology | Admitting: Obstetrics and Gynecology

## 2020-07-05 ENCOUNTER — Other Ambulatory Visit: Payer: Self-pay

## 2020-07-05 DIAGNOSIS — Z20822 Contact with and (suspected) exposure to covid-19: Secondary | ICD-10-CM | POA: Insufficient documentation

## 2020-07-05 DIAGNOSIS — F6089 Other specific personality disorders: Secondary | ICD-10-CM | POA: Insufficient documentation

## 2020-07-05 DIAGNOSIS — J45909 Unspecified asthma, uncomplicated: Secondary | ICD-10-CM | POA: Insufficient documentation

## 2020-07-05 DIAGNOSIS — O368120 Decreased fetal movements, second trimester, not applicable or unspecified: Secondary | ICD-10-CM | POA: Diagnosis present

## 2020-07-05 DIAGNOSIS — F419 Anxiety disorder, unspecified: Secondary | ICD-10-CM | POA: Diagnosis not present

## 2020-07-05 DIAGNOSIS — O99342 Other mental disorders complicating pregnancy, second trimester: Secondary | ICD-10-CM | POA: Diagnosis not present

## 2020-07-05 DIAGNOSIS — O021 Missed abortion: Secondary | ICD-10-CM | POA: Diagnosis present

## 2020-07-05 DIAGNOSIS — O9952 Diseases of the respiratory system complicating childbirth: Secondary | ICD-10-CM | POA: Insufficient documentation

## 2020-07-05 DIAGNOSIS — F4310 Post-traumatic stress disorder, unspecified: Secondary | ICD-10-CM | POA: Diagnosis not present

## 2020-07-05 DIAGNOSIS — O99332 Smoking (tobacco) complicating pregnancy, second trimester: Secondary | ICD-10-CM | POA: Insufficient documentation

## 2020-07-05 DIAGNOSIS — Z3A19 19 weeks gestation of pregnancy: Secondary | ICD-10-CM | POA: Insufficient documentation

## 2020-07-05 DIAGNOSIS — O039 Complete or unspecified spontaneous abortion without complication: Secondary | ICD-10-CM | POA: Diagnosis not present

## 2020-07-05 HISTORY — DX: Depression, unspecified: F32.A

## 2020-07-05 LAB — URINALYSIS, COMPLETE (UACMP) WITH MICROSCOPIC
Bilirubin Urine: NEGATIVE
Glucose, UA: NEGATIVE mg/dL
Hgb urine dipstick: NEGATIVE
Ketones, ur: NEGATIVE mg/dL
Leukocytes,Ua: NEGATIVE
Nitrite: NEGATIVE
Protein, ur: NEGATIVE mg/dL
Specific Gravity, Urine: 1.016 (ref 1.005–1.030)
pH: 6 (ref 5.0–8.0)

## 2020-07-05 LAB — COMPREHENSIVE METABOLIC PANEL
ALT: 18 U/L (ref 0–44)
AST: 19 U/L (ref 15–41)
Albumin: 3.1 g/dL — ABNORMAL LOW (ref 3.5–5.0)
Alkaline Phosphatase: 67 U/L (ref 38–126)
Anion gap: 8 (ref 5–15)
BUN: 11 mg/dL (ref 6–20)
CO2: 21 mmol/L — ABNORMAL LOW (ref 22–32)
Calcium: 8.5 mg/dL — ABNORMAL LOW (ref 8.9–10.3)
Chloride: 106 mmol/L (ref 98–111)
Creatinine, Ser: 0.73 mg/dL (ref 0.44–1.00)
GFR calc Af Amer: >60 mL/min (ref >60)
GFR calc non Af Amer: >60 mL/min (ref >60)
Glucose, Bld: 114 mg/dL — ABNORMAL HIGH (ref 70–99)
Potassium: 3.5 mmol/L (ref 3.5–5.1)
Sodium: 135 mmol/L (ref 135–145)
Total Bilirubin: 0.4 mg/dL (ref 0.3–1.2)
Total Protein: 6.6 g/dL (ref 6.5–8.1)

## 2020-07-05 LAB — URINE DRUG SCREEN, QUALITATIVE (ARMC ONLY)
Amphetamines, Ur Screen: NOT DETECTED
Barbiturates, Ur Screen: NOT DETECTED
Benzodiazepine, Ur Scrn: NOT DETECTED
Cannabinoid 50 Ng, Ur ~~LOC~~: POSITIVE — AB
Cocaine Metabolite,Ur ~~LOC~~: NOT DETECTED
MDMA (Ecstasy)Ur Screen: NOT DETECTED
Methadone Scn, Ur: NOT DETECTED
Opiate, Ur Screen: NOT DETECTED
Phencyclidine (PCP) Ur S: NOT DETECTED
Tricyclic, Ur Screen: NOT DETECTED

## 2020-07-05 LAB — TYPE AND SCREEN
ABO/RH(D): A NEG
Antibody Screen: NEGATIVE

## 2020-07-05 LAB — CBC
HCT: 34.6 % — ABNORMAL LOW (ref 36.0–46.0)
Hemoglobin: 12.1 g/dL (ref 12.0–15.0)
MCH: 30.8 pg (ref 26.0–34.0)
MCHC: 35 g/dL (ref 30.0–36.0)
MCV: 88 fL (ref 80.0–100.0)
Platelets: 249 10*3/uL (ref 150–400)
RBC: 3.93 MIL/uL (ref 3.87–5.11)
RDW: 12.7 % (ref 11.5–15.5)
WBC: 16.4 10*3/uL — ABNORMAL HIGH (ref 4.0–10.5)
nRBC: 0 % (ref 0.0–0.2)

## 2020-07-05 LAB — SARS CORONAVIRUS 2 BY RT PCR (HOSPITAL ORDER, PERFORMED IN ~~LOC~~ HOSPITAL LAB): SARS Coronavirus 2: NEGATIVE

## 2020-07-05 MED ORDER — FENTANYL CITRATE (PF) 100 MCG/2ML IJ SOLN
50.0000 ug | INTRAMUSCULAR | Status: DC | PRN
  Administered 2020-07-06: 100 ug via INTRAVENOUS
  Filled 2020-07-05: qty 2

## 2020-07-05 MED ORDER — LACTATED RINGERS IV SOLN: INTRAVENOUS | Status: DC

## 2020-07-05 MED ORDER — OXYCODONE-ACETAMINOPHEN 5-325 MG PO TABS: 2.0000 | ORAL_TABLET | ORAL | Status: DC | PRN

## 2020-07-05 MED ORDER — LIDOCAINE HCL (PF) 1 % IJ SOLN: 30.0000 mL | INTRAMUSCULAR | Status: DC | PRN

## 2020-07-05 MED ORDER — OXYTOCIN-SODIUM CHLORIDE 30-0.9 UT/500ML-% IV SOLN
2.5000 [IU]/h | INTRAVENOUS | Status: DC
  Filled 2020-07-05 (×3): qty 500

## 2020-07-05 MED ORDER — ZOLPIDEM TARTRATE 5 MG PO TABS
5.0000 mg | ORAL_TABLET | Freq: Every evening | ORAL | Status: DC | PRN
  Administered 2020-07-06: 5 mg via ORAL
  Filled 2020-07-05: qty 1

## 2020-07-05 MED ORDER — ONDANSETRON HCL 4 MG/2ML IJ SOLN: 4.0000 mg | Freq: Four times a day (QID) | INTRAMUSCULAR | Status: DC | PRN

## 2020-07-05 MED ORDER — ACETAMINOPHEN 325 MG PO TABS: 650.0000 mg | ORAL_TABLET | ORAL | Status: DC | PRN

## 2020-07-05 MED ORDER — MONTELUKAST SODIUM 10 MG PO TABS
10.0000 mg | ORAL_TABLET | Freq: Every day | ORAL | Status: DC
  Administered 2020-07-05: 10 mg via ORAL
  Filled 2020-07-05: qty 1

## 2020-07-05 MED ORDER — MISOPROSTOL 200 MCG PO TABS
200.0000 ug | ORAL_TABLET | Freq: Four times a day (QID) | ORAL | Status: DC
  Administered 2020-07-05 – 2020-07-06 (×3): 200 ug via VAGINAL
  Filled 2020-07-05 (×3): qty 1

## 2020-07-05 MED ORDER — SOD CITRATE-CITRIC ACID 500-334 MG/5ML PO SOLN: 30.0000 mL | ORAL | Status: DC | PRN

## 2020-07-05 MED ORDER — LACTATED RINGERS IV SOLN: 500.0000 mL | INTRAVENOUS | Status: DC | PRN

## 2020-07-05 MED ORDER — MISOPROSTOL 200 MCG PO TABS
ORAL_TABLET | ORAL | Status: AC
  Filled 2020-07-05: qty 4

## 2020-07-05 MED ORDER — OXYTOCIN BOLUS FROM INFUSION: 333.0000 mL | Freq: Once | INTRAVENOUS | Status: DC

## 2020-07-05 MED ORDER — OXYCODONE-ACETAMINOPHEN 5-325 MG PO TABS: 1.0000 | ORAL_TABLET | ORAL | Status: DC | PRN

## 2020-07-05 MED ORDER — BUDESONIDE 0.25 MG/2ML IN SUSP
0.2500 mg | Freq: Two times a day (BID) | RESPIRATORY_TRACT | Status: DC
  Administered 2020-07-05 – 2020-07-06 (×2): 0.25 mg via RESPIRATORY_TRACT
  Filled 2020-07-05 (×3): qty 2

## 2020-07-05 NOTE — H&P (Signed)
OB History & Physical   History of Present Illness:  Chief Complaint: decreased fetal heart rate when she used her Doppler at home.   HPI:  Roselee Tayloe is a 34 y.o. G53P0020 female at [redacted]w[redacted]d dated by LMP and c/w [place] at [redacted]w[redacted]d.   - Pt reports having no discomfort, bleeding or leaking of fluid. Denies vaginal discharge.  - Pt reports that she uses her doppler at home every few days, and today when she attempted it, FHR was in 90s and she became concerned for fetal well being.  - Pt denies feeling any fetal movement this pregnancy.  - Last prenatal appt was approx 4 weeks ago, she had negative genetic screening in early pregnancy.   FHR attempted with doppler in ER x 2, then bedside US performed and no fetal cardiac activity seen. Stat OB US ordered- no fetal cardiac activity and fetal hydrops seen per formal report, fetal BPD measuring [redacted]w[redacted]d, marginal previa at 1.5cm from cervical os.    Pregnancy Issues: 1. Asthma, Qvar 40mg  BID, Singulair 10mg  daily, rescue inhaler 2. Current smoker, approx 1/4 to 1/2ppd, trying to quit.  3. Hx anxiety and depression, previously on Zoloft, pt reports hx "self-harm" Sees counselor prn.  4. RH negative  5. Current MJ use 6. Hx miscarriage x 2, 1st-12wks in 2008, 2nd- 2020 at 5wks   Maternal Medical History:   Past Medical History:  Diagnosis Date  . Asthma   . Depression   . Migraines   . Miscarriage     Past Surgical History:  Procedure Laterality Date  . CYST REMOVAL TRUNK    . TONSILLECTOMY      Allergies  Allergen Reactions  . Other     Pt states she has an allergy to a medication that starts with the letter Z. Pt states it was given IV and that she was very hot and itchy.     Prior to Admission medications   Medication Sig Start Date End Date Taking? Authorizing Provider  Prenatal Vit-Fe Fumarate-FA (PRENATAL MULTIVITAMIN) TABS tablet Take 1 tablet by mouth daily at 12 noon.   Yes [provider]     Prenatal care site:  [facility] History: She  reports that she has been smoking. She has been smoking about 0.25 packs per day. She has never used smokeless tobacco. She reports current drug use. Frequency: 6.00 times per week. Drug: Marijuana. She reports that she does not drink alcohol.  Family History: family history includes Diabetes in her father; Endometriosis in her mother.   Review of Systems: A full review of systems was performed and negative except as noted in the HPI.     Physical Exam:  Vital Signs: BP (!) 114/56 (BP Location: Left Arm)   Pulse 77   Temp 98.4 F (36.9 C) (Oral)   Resp 16   Ht 5\' 5"  (1.651 m)   Wt 82.1 kg   LMP 02/16/2020 (Approximate)   SpO2 98%   BMI 30.12 kg/m  General: no acute distress.  HEENT: normocephalic, atraumatic Heart: regular rate & rhythm.  No murmurs/rubs/gallops Lungs: clear to auscultation bilaterally, normal respiratory effort Abdomen: soft, gravid, non-tender Pelvic:   External: Normal external female genitalia  Cervix: Dilation: Closed / Effacement (%): 50 /      Extremities: non-tender, symmetric, no edema bilaterally.  DTRs: 2+  Neurologic: Alert & oriented x 3.    Results for orders placed or performed during the hospital encounter of 07/05/20 (from the past  24 hour(s))  SARS Coronavirus 2 by RT PCR (hospital order, performed in [place] hospital lab) Nasopharyngeal Nasopharyngeal Swab     Status: None   Collection Time: 07/05/20  8:24 PM   Specimen: Nasopharyngeal Swab  Result Value Ref Range   SARS Coronavirus 2 NEGATIVE NEGATIVE  CBC     Status: Abnormal   Collection Time: 07/05/20  8:24 PM  Result Value Ref Range   WBC 16.4 (H) 4.0 - 10.5 K/uL   RBC 3.93 3.87 - 5.11 MIL/uL   Hemoglobin 12.1 12.0 - 15.0 g/dL   HCT 34.6 (L) 36 - 46 %   MCV 88.0 80.0 - 100.0 fL   MCH 30.8 26.0 - 34.0 pg   MCHC 35.0 30.0 - 36.0 g/dL   RDW 12.7 11.5 - 15.5 %   Platelets 249 150 - 400 K/uL   nRBC 0.0 0.0 - 0.2 %  Type and screen  [place]     Status: None   Collection Time: 07/05/20  8:24 PM  Result Value Ref Range   ABO/RH(D) A NEG    Antibody Screen NEG    Sample Expiration      07/08/2020,2359 Performed at [facility], [address] [place], [place] [address]   Comprehensive metabolic panel     Status: Abnormal   Collection Time: 07/05/20  8:24 PM  Result Value Ref Range   Sodium 135 135 - 145 mmol/L   Potassium 3.5 3.5 - 5.1 mmol/L   Chloride 106 98 - 111 mmol/L   CO2 21 (L) 22 - 32 mmol/L   Glucose, Bld 114 (H) 70 - 99 mg/dL   BUN 11 6 - 20 mg/dL   Creatinine, Ser 0.73 0.44 - 1.00 mg/dL   Calcium 8.5 (L) 8.9 - 10.3 mg/dL   Total Protein 6.6 6.5 - 8.1 g/dL   Albumin 3.1 (L) 3.5 - 5.0 g/dL   AST 19 15 - 41 U/L   ALT 18 0 - 44 U/L   Alkaline Phosphatase 67 38 - 126 U/L   Total Bilirubin 0.4 0.3 - 1.2 mg/dL   GFR calc non Af Amer >60 >60 mL/min   GFR calc Af Amer >60 >60 mL/min   Anion gap 8 5 - 15  Urinalysis, Complete w Microscopic     Status: Abnormal   Collection Time: 07/05/20  9:45 PM  Result Value Ref Range   Color, Urine YELLOW (A) YELLOW   APPearance CLEAR (A) CLEAR   Specific Gravity, Urine 1.016 1.005 - 1.030   pH 6.0 5.0 - 8.0   Glucose, UA NEGATIVE NEGATIVE mg/dL   Hgb urine dipstick NEGATIVE NEGATIVE   Bilirubin Urine NEGATIVE NEGATIVE   Ketones, ur NEGATIVE NEGATIVE mg/dL   Protein, ur NEGATIVE NEGATIVE mg/dL   Nitrite NEGATIVE NEGATIVE   Leukocytes,Ua NEGATIVE NEGATIVE   RBC / HPF 0-5 0 - 5 RBC/hpf   WBC, UA 0-5 0 - 5 WBC/hpf   Bacteria, UA RARE (A) NONE SEEN   Squamous Epithelial / LPF 0-5 0 - 5   Mucus PRESENT   Urine Drug Screen, Qualitative ([facility] only)     Status: Abnormal   Collection Time: 07/05/20  9:45 PM  Result Value Ref Range   Tricyclic, Ur Screen NONE DETECTED NONE DETECTED   Amphetamines, Ur Screen NONE DETECTED NONE DETECTED   MDMA (Ecstasy)Ur Screen NONE DETECTED NONE DETECTED   Cocaine Metabolite,Ur [place] NONE DETECTED  NONE DETECTED   Opiate, Ur Screen NONE DETECTED NONE DETECTED  Phencyclidine (PCP) Ur S NONE DETECTED NONE DETECTED   Cannabinoid 50 Ng, Ur [place] POSITIVE (A) NONE DETECTED   Barbiturates, Ur Screen NONE DETECTED NONE DETECTED   Benzodiazepine, Ur Scrn NONE DETECTED NONE DETECTED   Methadone Scn, Ur NONE DETECTED NONE DETECTED    Pertinent Results:  Prenatal Labs: Blood type/Rh  A NEG  Antibody screen neg  Rubella Immune  Varicella Immune  RPR NR  HBsAg Neg  HIV NR  GC neg  Chlamydia neg  Genetic screening Negative MaterniT21  1 hour GTT  n/a  GBS  n/a    TOCO: unable to trace UCs with toco SVE:  Dilation: Closed / Effacement (%): 50 /      Cephalic by leopolds  US OB Limited  Result Date: 07/05/2020 CLINICAL DATA:  Pregnant, decreased fetal movement, diminished fetal heart tones. EXAM: LIMITED OBSTETRIC ULTRASOUND FINDINGS: Number of Fetuses: 1 Heart Rate: No cardiac movement identifiable. Additionally, there is increasing hydrops of the fetus noted. Movement: None Presentation: Breech Placental Location: Posterior Previa: Marginal, approximately 1.5 cm separating the proximal margin of the placenta to the internal cervical os. Amniotic Fluid (Subjective):  Within normal limits. BPD: 3.6 cm 17 w  0 d MATERNAL FINDINGS: Cervix:  Appears closed. Uterus/Adnexae: No abnormality visualized. IMPRESSION: Intrauterine fetal demise. This exam is performed on an emergent basis and does not comprehensively evaluate fetal size, dating, or anatomy; follow-up complete OB US should be considered if further fetal assessment is warranted. Electronically Signed   By: Fidela Salisbury MD   On: 07/05/2020 19:09    Assessment:  Tanika Bracco is a 34 y.o. G38P0020 female at [redacted]w[redacted]d with 2nd trimester fetal demise.   Plan:  1. Admit to Labor & Delivery; consents reviewed and obtained - Pt desires IOL due to IUFD at 18.6wks - d/w Dr Ouida Sills regarding mgmt of IUFD; Cytotec 217mcg PV q6hrs ordered.  TORCH titers ordered with admission labs and COVID swab.  - pt with asthma on QVAR and singulair and current tobacco and MJ smoker; lungs CTAB, pulmicort and singulair substitutes ordered for inpatient. Offered nicotine patch, pt declined.  - Pt undecided on disposition of fetal remains, would like to see baby.  - discussed with pt and spouse that fetus has hydrops, likely non-intact skin and discoloration due to indeterminant time since he died.  - discussed risk of bleeding, risk of retained placenta.   2. Routine OB: - Prenatal labs reviewed, as above - Rh negative, will need rhogam after delivery.  - CBC, T&S, RPR on admit - Clear fluids, IVF  4. Induction of Labor -  Plan for induction with cytotec 200mg  q6 -  Maternal pain control as desired; requesting IVPM, nitrous, regional anesthesia - Anticipate vaginal delivery    Francetta Found, CNM 07/05/20 11:11 PM

## 2020-07-05 NOTE — ED Notes (Signed)
[place] called and informed of needing STAT US for pt.

## 2020-07-05 NOTE — ED Notes (Signed)
OB RN and midwife at bedside for stat FHT. Midwife ordered stat US.

## 2020-07-05 NOTE — ED Notes (Signed)
Pt states he is 19 weeks and used doppler at home and it read 90 as baby's heartbeat

## 2020-07-05 NOTE — ED Triage Notes (Signed)
Pt is 18wk6days and states decreased fetal heart tones with at home monitor. Pt has had 2 miscarriages before.

## 2020-07-06 ENCOUNTER — Inpatient Hospital Stay: Payer: Medicaid Other | Admitting: Anesthesiology

## 2020-07-06 ENCOUNTER — Encounter: Payer: Self-pay | Admitting: Obstetrics and Gynecology

## 2020-07-06 DIAGNOSIS — O039 Complete or unspecified spontaneous abortion without complication: Secondary | ICD-10-CM | POA: Diagnosis not present

## 2020-07-06 LAB — CBC
HCT: 31.9 % — ABNORMAL LOW (ref 36.0–46.0)
Hemoglobin: 10.9 g/dL — ABNORMAL LOW (ref 12.0–15.0)
MCH: 30.4 pg (ref 26.0–34.0)
MCHC: 34.2 g/dL (ref 30.0–36.0)
MCV: 88.9 fL (ref 80.0–100.0)
Platelets: 228 10*3/uL (ref 150–400)
RBC: 3.59 MIL/uL — ABNORMAL LOW (ref 3.87–5.11)
RDW: 12.5 % (ref 11.5–15.5)
WBC: 13 10*3/uL — ABNORMAL HIGH (ref 4.0–10.5)
nRBC: 0 % (ref 0.0–0.2)

## 2020-07-06 LAB — RPR: RPR Ser Ql: NONREACTIVE

## 2020-07-06 MED ORDER — WITCH HAZEL-GLYCERIN EX PADS: 1.0000 "application " | MEDICATED_PAD | CUTANEOUS | Status: DC

## 2020-07-06 MED ORDER — DIBUCAINE (PERIANAL) 1 % EX OINT: 1.0000 "application " | TOPICAL_OINTMENT | CUTANEOUS | Status: DC | PRN

## 2020-07-06 MED ORDER — FENTANYL 2.5 MCG/ML W/ROPIVACAINE 0.15% IN NS 100 ML EPIDURAL (ARMC)
EPIDURAL | Status: DC | PRN
  Administered 2020-07-06: 12 mL/h via EPIDURAL

## 2020-07-06 MED ORDER — BENZOCAINE-MENTHOL 20-0.5 % EX AERO: 1.0000 "application " | INHALATION_SPRAY | CUTANEOUS | Status: DC | PRN

## 2020-07-06 MED ORDER — PHENYLEPHRINE 40 MCG/ML (10ML) SYRINGE FOR IV PUSH (FOR BLOOD PRESSURE SUPPORT): 80.0000 ug | PREFILLED_SYRINGE | INTRAVENOUS | Status: DC | PRN

## 2020-07-06 MED ORDER — BUPIVACAINE HCL (PF) 0.25 % IJ SOLN
INTRAMUSCULAR | Status: DC | PRN
  Administered 2020-07-06: 5 mL via EPIDURAL
  Administered 2020-07-06: 3 mL via EPIDURAL

## 2020-07-06 MED ORDER — PRENATAL MULTIVITAMIN CH: 1.0000 | ORAL_TABLET | Freq: Every day | ORAL | Status: DC

## 2020-07-06 MED ORDER — IBUPROFEN 600 MG PO TABS
600.0000 mg | ORAL_TABLET | Freq: Four times a day (QID) | ORAL | Status: DC
  Filled 2020-07-06: qty 1

## 2020-07-06 MED ORDER — SENNOSIDES-DOCUSATE SODIUM 8.6-50 MG PO TABS: 2.0000 | ORAL_TABLET | ORAL | Status: DC

## 2020-07-06 MED ORDER — FENTANYL 2.5 MCG/ML W/ROPIVACAINE 0.15% IN NS 100 ML EPIDURAL (ARMC): 12.0000 mL/h | EPIDURAL | Status: DC

## 2020-07-06 MED ORDER — COCONUT OIL OIL: 1.0000 "application " | TOPICAL_OIL | Status: DC | PRN

## 2020-07-06 MED ORDER — DIPHENHYDRAMINE HCL 50 MG/ML IJ SOLN: 12.5000 mg | INTRAMUSCULAR | Status: DC | PRN

## 2020-07-06 MED ORDER — ONDANSETRON HCL 4 MG/2ML IJ SOLN: 4.0000 mg | INTRAMUSCULAR | Status: DC | PRN

## 2020-07-06 MED ORDER — LACTATED RINGERS IV SOLN
500.0000 mL | Freq: Once | INTRAVENOUS | Status: AC
  Administered 2020-07-06: 500 mL via INTRAVENOUS

## 2020-07-06 MED ORDER — MISOPROSTOL 200 MCG PO TABS: 400.0000 ug | ORAL_TABLET | ORAL | Status: DC

## 2020-07-06 MED ORDER — EPHEDRINE 5 MG/ML INJ: 10.0000 mg | INTRAVENOUS | Status: DC | PRN

## 2020-07-06 MED ORDER — CEFAZOLIN SODIUM-DEXTROSE 2-4 GM/100ML-% IV SOLN
2.0000 g | Freq: Once | INTRAVENOUS | Status: AC
  Administered 2020-07-06: 2 g via INTRAVENOUS
  Filled 2020-07-06: qty 100

## 2020-07-06 MED ORDER — FENTANYL 2.5 MCG/ML W/ROPIVACAINE 0.15% IN NS 100 ML EPIDURAL (ARMC)
EPIDURAL | Status: AC
  Filled 2020-07-06: qty 100

## 2020-07-06 MED ORDER — ACETAMINOPHEN 325 MG PO TABS: 650.0000 mg | ORAL_TABLET | ORAL | Status: DC | PRN

## 2020-07-06 MED ORDER — LIDOCAINE-EPINEPHRINE (PF) 1.5 %-1:200000 IJ SOLN
INTRAMUSCULAR | Status: DC | PRN
  Administered 2020-07-06: 4 mL via EPIDURAL

## 2020-07-06 MED ORDER — SIMETHICONE 80 MG PO CHEW: 160.0000 mg | CHEWABLE_TABLET | ORAL | Status: DC | PRN

## 2020-07-06 MED ORDER — LIDOCAINE HCL (PF) 1 % IJ SOLN
INTRAMUSCULAR | Status: DC | PRN
  Administered 2020-07-06: 4 mL via SUBCUTANEOUS

## 2020-07-06 MED ORDER — DIPHENHYDRAMINE HCL 25 MG PO CAPS: 25.0000 mg | ORAL_CAPSULE | Freq: Four times a day (QID) | ORAL | Status: DC | PRN

## 2020-07-06 MED ORDER — ONDANSETRON HCL 4 MG PO TABS: 4.0000 mg | ORAL_TABLET | ORAL | Status: DC | PRN

## 2020-07-06 MED ORDER — RHO D IMMUNE GLOBULIN 1500 UNIT/2ML IJ SOSY
300.0000 ug | PREFILLED_SYRINGE | Freq: Once | INTRAMUSCULAR | Status: AC
  Administered 2020-07-06: 300 ug via INTRAMUSCULAR
  Filled 2020-07-06: qty 2

## 2020-07-06 NOTE — Anesthesia Preprocedure Evaluation (Signed)
Anesthesia Evaluation  Patient identified by MRN, date of birth, ID band Patient awake    Reviewed: Allergy & Precautions, H&P , NPO status , Patient's Chart, lab work & pertinent test results, reviewed documented beta blocker date and time   Airway Mallampati: II  TM Distance: >3 FB Neck ROM: full    Dental no notable dental hx. (+) Teeth Intact   Pulmonary asthma , Current Smoker,    Pulmonary exam normal breath sounds clear to auscultation       Cardiovascular Exercise Tolerance: Good negative cardio ROS   Rhythm:regular Rate:Normal     Neuro/Psych  Headaches, PSYCHIATRIC DISORDERS Anxiety Depression    GI/Hepatic negative GI ROS, Neg liver ROS,   Endo/Other  negative endocrine ROSdiabetes  Renal/GU      Musculoskeletal   Abdominal   Peds  Hematology negative hematology ROS (+)   Anesthesia Other Findings   Reproductive/Obstetrics (+) Pregnancy                             Anesthesia Physical Anesthesia Plan  ASA: II  Anesthesia Plan: Epidural   Post-op Pain Management:    Induction:   PONV Risk Score and Plan:   Airway Management Planned:   Additional Equipment:   Intra-op Plan:   Post-operative Plan:   Informed Consent: I have reviewed the patients History and Physical, chart, labs and discussed the procedure including the risks, benefits and alternatives for the proposed anesthesia with the patient or authorized representative who has indicated his/her understanding and acceptance.       Plan Discussed with:   Anesthesia Plan Comments:         Anesthesia Quick Evaluation

## 2020-07-06 NOTE — Anesthesia Procedure Notes (Signed)
Epidural Patient location during procedure: OB  Staffing Performed: anesthesiologist   Preanesthetic Checklist Completed: patient identified, IV checked, site marked, risks and benefits discussed, surgical consent, monitors and equipment checked, pre-op evaluation and timeout performed  Epidural Patient position: sitting Prep: Betadine Patient monitoring: heart rate, continuous pulse ox and blood pressure Approach: midline Location: L3-L4 Injection technique: LOR saline  Needle:  Needle type: Tuohy  Needle gauge: 17 G Needle length: 9 cm and 9 Needle insertion depth: 6 cm Catheter type: closed end flexible Catheter size: 19 Gauge Catheter at skin depth: 12 cm Test dose: negative and 1.5% lidocaine with Epi 1:200 K  Assessment Sensory level: T10 Events: blood not aspirated, injection not painful, no injection resistance, no paresthesia and negative IV test  Additional Notes   Patient tolerated the insertion well without complications.-SATD -IVTD. No paresthesia. Refer to [facility] nursing for VS and dosingReason for block:procedure for pain     

## 2020-07-06 NOTE — Progress Notes (Signed)
Labor Progress Note  Diane Powell is a 34 y.o. G3P0020 at [redacted]w[redacted]d by LMP admitted for induction of labor for IUFD.  Subjective:  Comfortable with epidural.   Objective: BP 118/67 (BP Location: Left Arm)   Pulse 73   Temp 98.6 F (37 C) (Oral)   Resp 17   Ht 5\' 5"  (1.651 m)   Wt 82.1 kg   LMP 02/16/2020 (Approximate)   SpO2 96%   BMI 30.12 kg/m   SVE:  Deferred at this time, last exam 1cm/50%/0 firm/anterior Membrane status: intact Amniotic color: n/a  Labs: Lab Results  Component Value Date   WBC 16.4 (H) 07/05/2020   HGB 12.1 07/05/2020   HCT 34.6 (L) 07/05/2020   MCV 88.0 07/05/2020   PLT 249 07/05/2020    Assessment / Plan: Induction of labor due to IUFD  Labor: s/p misoprostol x3, plan to continue 448mcg PV q4h Pain Control:  Epidural I/D:  n/a Anticipated MOD:  NSVD  Lisette Grinder, CNM 07/06/2020, 8:26 AM

## 2020-07-06 NOTE — Procedures (Addendum)
Operative Report Dilation and Curettage   Indications: Retained products of conception   Pre-operative Diagnosis: Missed abortion at 19 weeks  Post-operative Diagnosis: same.  Procedure: 1. Postpartum dilation and curettage under ultrasound guidance  Surgeon: Benjaman Kindler, MD  Assistant(s):  Lisette Grinder, CNM  Anesthesia: Epidural anesthesia  Estimated Blood Loss:  418ml         Intraoperative medications: Ancef 2g         Total IV Fluids: n/a  Urine Output: n/a         Specimens: Placenta         Complications:  None; patient tolerated the procedure well.         Disposition: L&D recovery room         Condition: stable  Findings: Cervix measuring 3cm/100% effaced with palpable products here. Ultrasound at the bedside found large clots in the lower uterus, with a thin stripe at the fundus.   Procedure Details:   On bedside ultrasound, thickened stripe with retained products and blood clots noted.  After assuring adequate pain control, I was able to pass a ring forcep and under ultrasound guidance with many passes, remove the POC. A sharp curettage was then performed until there was a gritty texture in all four quadrants.     The patient tolerated the procedure well and was taken to the recovery area awake, extubated and in stable condition.  We will give her 2g Ancef for the bedside manipulation that lasted about 30 min. I offered to take her to the OR but her pain control was good and she preferred to stay in the room.

## 2020-07-06 NOTE — Progress Notes (Signed)
Pt given discharge orders along with follow up care, meds and precautions. Pt verbalized understanding and had no questions. Pt discharged with significant other.

## 2020-07-06 NOTE — Discharge Summary (Signed)
Obstetric Discharge Summary   Patient Name: Diane Powell DOB: September 14, 1986 MRN: [id number]  Date of Admission: 07/05/2020 Date of Delivery: 07/05/2020 Delivered by: Lisette Grinder CNM and Benjaman Kindler MD Date of Discharge: 07/06/2020  Primary OB: [place] [facility]  ONG:[id number]'L last menstrual period was 02/16/2020 (approximate). EDC Estimated Date of Delivery: 11/30/20 Gestational Age at Delivery: [redacted]w[redacted]d   Antepartum complications:  1. Asthma, Qvar 40mg  BID, Singulair 10mg  daily, rescue inhaler 2. Current smoker, approx 1/4 to 1/2ppd, trying to quit.  3. Hx anxiety and depression, previously on Zoloft, pt reports hx "self-harm" Sees counselor prn.  4. RH negative  5. Current MJ use 6. Hx miscarriage x 2, 1st-12wks in 2008, 2nd- 2020 at 5wks  Admitting Diagnosis: IUFD  Secondary Diagnoses: Patient Active Problem List   Diagnosis Date Noted  . Missed abortion with fetal demise before 57 completed weeks of gestation 07/05/2020  . PTSD (post-traumatic stress disorder) 10/16/2018  . Cluster B personality disorder ([place]) 10/16/2018  . Cannabis use disorder, moderate, dependence ([place]) 10/15/2018  . Amphetamine use disorder, severe ([place]) 10/15/2018  . Major depressive disorder, recurrent severe without psychotic features ([place]) 10/15/2018  . Suicidal ideation 10/15/2018    Induction: Cytotec Complications: IUFD Intrapartum complications/course: She presented to L&D after decreased fetal heart tones at home. IUFD was diagnosed. She was induced with misoprostol x 3 doses. She called out to state that she felt pressure. By the time the RN got to the bedside, the baby had been delivered and was in the bed. She had a spontaneous vaginal birth of a stillborn female. Placenta was found to be in the vagina, so patient was asked to push. She pushed and the placenta delivered slowly, tattered. Cervical exam after delivery of the placenta was suspicious for retained placenta. Unable to remove  this piece with cervical sweep. Dr. Leafy Ro called to the bedside to assess. Please see her note for further detail on removal of placental fragments.   Delivery Type: spontaneous vaginal delivery Anesthesia: epidural Placenta: spontaneous Laceration: none Episiotomy: none  Newborn Data: Still born female "Eli" APGAR: 0, 0  Newborn Delivery   Birth date/time: 07/06/2020 08:55:00 Delivery type: Vaginal, Spontaneous      Postpartum Course  Patient had an uncomplicated postpartum course.  By time of discharge on PPD#0, her pain was controlled on oral pain medications; she had appropriate lochia and was ambulating, voiding without difficulty and tolerating regular diet.  She was deemed stable for discharge to home.       Edinburgh:  No flowsheet data found.   Labs: CBC Latest Ref Rng & Units 07/05/2020 11/26/2019 03/07/2019  WBC 4.0 - 10.5 K/uL 16.4(H) 9.3 10.5  Hemoglobin 12.0 - 15.0 g/dL 12.1 14.5 14.2  Hematocrit 36 - 46 % 34.6(L) 43.3 41.8  Platelets 150 - 400 K/uL 249 218 220   A NEG  Physical exam:  BP 124/64   Pulse 73   Temp 98.6 F (37 C) (Oral)   Resp 17   Ht 5\' 5"  (1.651 m)   Wt 82.1 kg   LMP 02/16/2020 (Approximate)   SpO2 99%   BMI 30.12 kg/m  General: alert and no distress Pulm: normal respiratory effort Lochia: appropriate Abdomen: soft, NT Uterine Fundus: firm, below umbilicus Extremities: No evidence of DVT seen on physical exam. No lower extremity edema.   Disposition: stable, discharge to home  Contraception: TBD  Prenatal Labs:  Blood type/Rh  A NEG  Antibody screen neg  Rubella Immune  Varicella Immune  RPR NR  HBsAg Neg  HIV NR  GC neg  Chlamydia neg  Genetic screening Negative MaterniT21  1 hour GTT  n/a  GBS  n/a    Rh Immune globulin given: given Rubella vaccine given: n/a Varicella vaccine given: n/a  Plan:  Diane Powell was discharged to home in good condition. Follow-up appointment at [facility] with  delivery provider in 4 weeks  Discharge Instructions: Per After Visit Summary. Activity: Advance as tolerated. Pelvic rest for 6 weeks.   Diet: Regular Discharge Medications:  Outpatient follow up:    Signed: Benjaman Kindler

## 2020-07-06 NOTE — Progress Notes (Signed)
Toco discontinued per CNM order

## 2020-07-06 NOTE — Progress Notes (Signed)
Labor Progress Note  Diane Powell is a 34 y.o. G3P0020 at [redacted]w[redacted]d by LMP admitted for IUFD  Subjective:  Comfortable now after epidural. Started hurting after 2nd dose of cytotec this am, given fentanyl x 1 dose with minimal relief.   Objective: BP 118/64   Pulse 81   Temp 98.2 F (36.8 C) (Oral)   Resp 16   Ht 5\' 5"  (1.651 m)   Wt 82.1 kg   LMP 02/16/2020 (Approximate)   SpO2 98%   BMI 30.12 kg/m  Notable VS details: reviewed.   General: no acute distress.  HEENT: normocephalic, atraumatic Heart: regular rate & rhythm.  No murmurs/rubs/gallops Lungs: clear to auscultation bilaterally, normal respiratory effort Abdomen: soft, gravid, non-tender            Extremities: non-tender, symmetric, no edema bilaterally.  DTRs: 2+  Neurologic: Alert & oriented x 3.  SVE:   1/50/0, firm/anterior.  Membrane status: intact Amniotic color:  n/a  Labs: Lab Results  Component Value Date   WBC 16.4 (H) 07/05/2020   HGB 12.1 07/05/2020   HCT 34.6 (L) 07/05/2020   MCV 88.0 07/05/2020   PLT 249 07/05/2020    Assessment / Plan: G3 P0020 at 18.6wks with IOL for IUFD  Labor: s/p 3rd dose of cytotec given now.  Preeclampsia:  no e/o Pre-e Pain Control:  Epidural I/D:  n/a Anticipated MOD: SVD  Murray Hodgkins Ly Wass, CNM 07/06/2020, 7:07 AM

## 2020-07-06 NOTE — Discharge Instructions (Signed)
Please call with symptoms of infection like a fever or increasing pelvic pain. Please start taking iron- can get over the counter  Please call if you have increasing, rather than decreasing, vaginal bleeding

## 2020-07-07 LAB — TORCH-IGM(TOXO/ RUB/ CMV/ HSV) W TITER
CMV IgM: 36.4 AU/mL — ABNORMAL HIGH (ref 0.0–29.9)
HSVI/II Comb IgM: <0.91 Ratio (ref 0.00–0.90)
Rubella IgM: <20 AU/mL (ref 0.0–19.9)
Toxoplasma Antibody- IgM: <3 AU/mL (ref 0.0–7.9)

## 2020-07-07 LAB — RHOGAM INJECTION: Unit division: 0

## 2020-07-07 LAB — INFECT DISEASE AB IGM REFLEX 1

## 2020-07-07 LAB — SURGICAL PATHOLOGY

## 2020-07-28 NOTE — Anesthesia Postprocedure Evaluation (Signed)
Anesthesia Post Note  Patient: Diane Powell  Procedure(s) Performed: AN AD HOC LABOR EPIDURAL  Patient location during evaluation: Mother Baby Anesthesia Type: Epidural Level of consciousness: awake and alert Pain management: pain level controlled Vital Signs Assessment: post-procedure vital signs reviewed and stable Respiratory status: spontaneous breathing, nonlabored ventilation and respiratory function stable Cardiovascular status: stable Postop Assessment: no headache, no backache and epidural receding Anesthetic complications: no Comments: Per chart review.  Pt discharged pre eval. ja   No complications documented.   Last Vitals: There were no vitals filed for this visit.  Last Pain: There were no vitals filed for this visit.               Molli Barrows

## 2020-08-10 ENCOUNTER — Other Ambulatory Visit: Payer: Medicaid Other

## 2020-08-10 ENCOUNTER — Other Ambulatory Visit: Payer: Self-pay | Admitting: Critical Care Medicine

## 2020-08-10 DIAGNOSIS — Z20822 Contact with and (suspected) exposure to covid-19: Secondary | ICD-10-CM

## 2020-08-11 LAB — NOVEL CORONAVIRUS, NAA: SARS-CoV-2, NAA: NOT DETECTED

## 2020-08-11 LAB — SARS-COV-2, NAA 2 DAY TAT

## 2021-04-28 IMAGING — US US THYROID
1 series · 13 of 25 positions shown · non-contrast
Comparison: None available

CLINICAL DATA: Nodule by outside imaging

EXAM:
THYROID ULTRASOUND
TECHNIQUE: Ultrasound examination of the thyroid gland and adjacent soft
tissues was performed.

[Series 1: us thyroid · 0.07mm/px · 58 acquisitions, 13 frames shown]
[im 1/58]
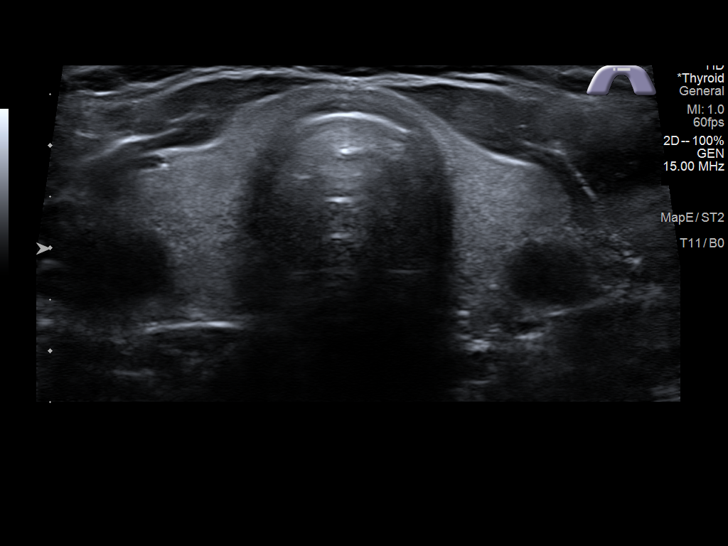
[im 5/58]
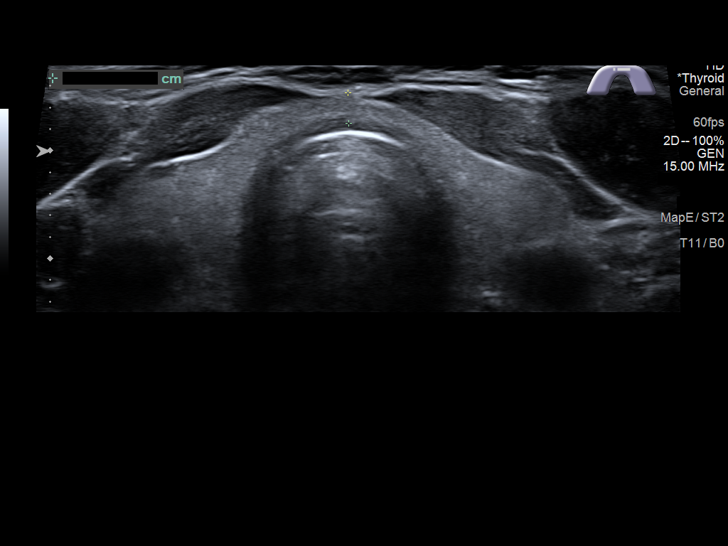
[im 10/58]
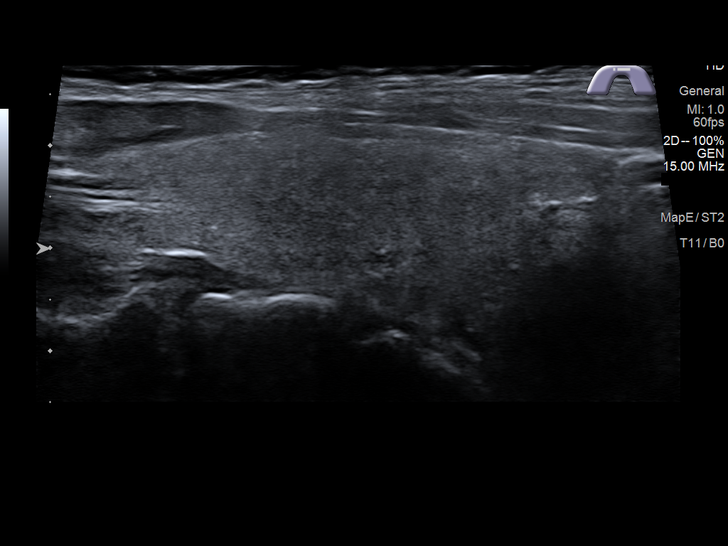
[im 15/58]
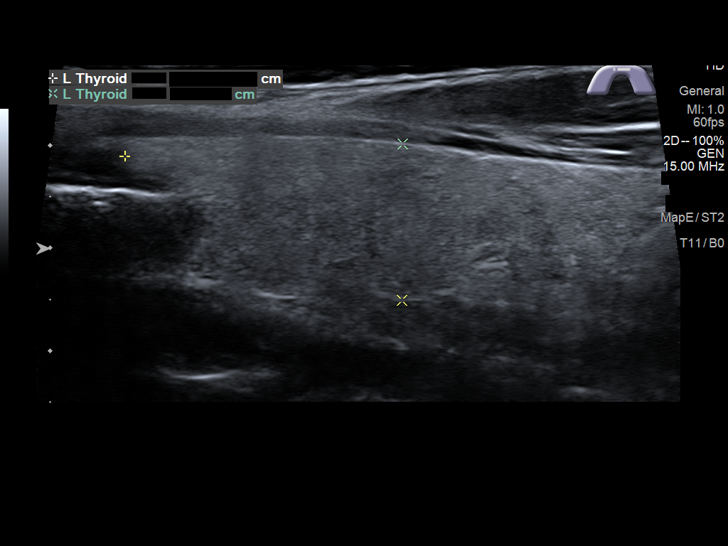
[im 20/58]
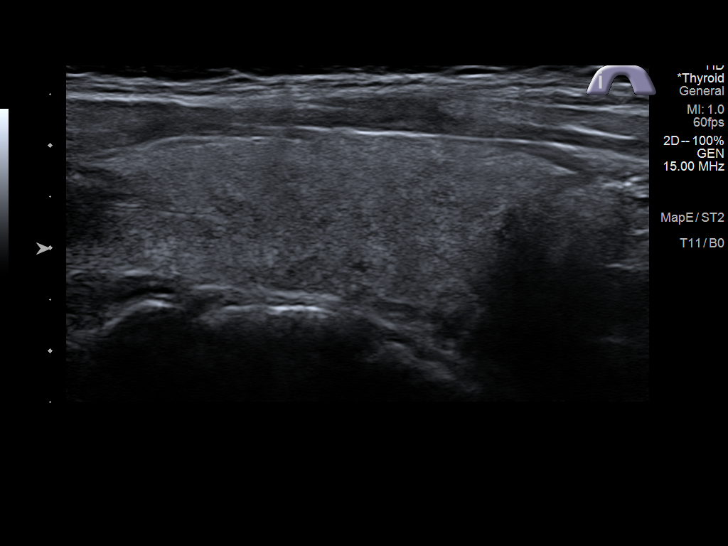
[im 24/58]
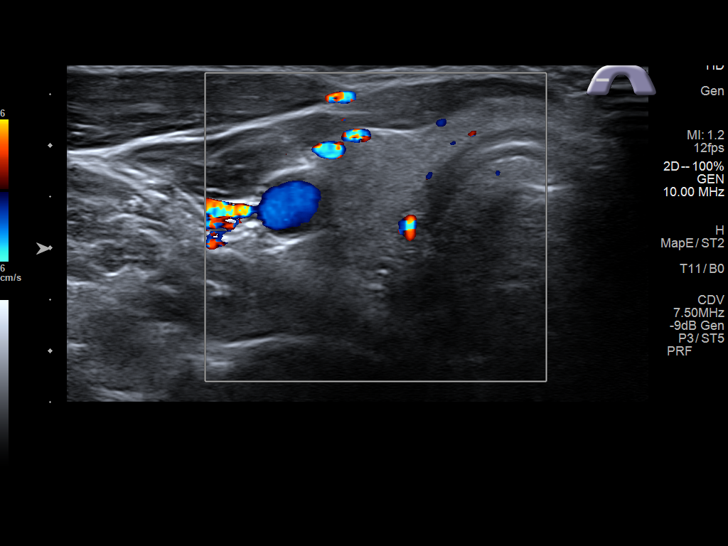
[im 29/58]
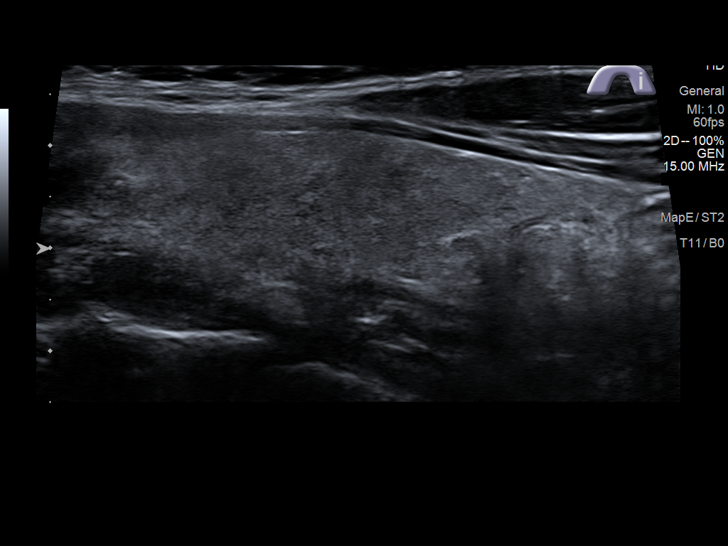
[im 34/58]
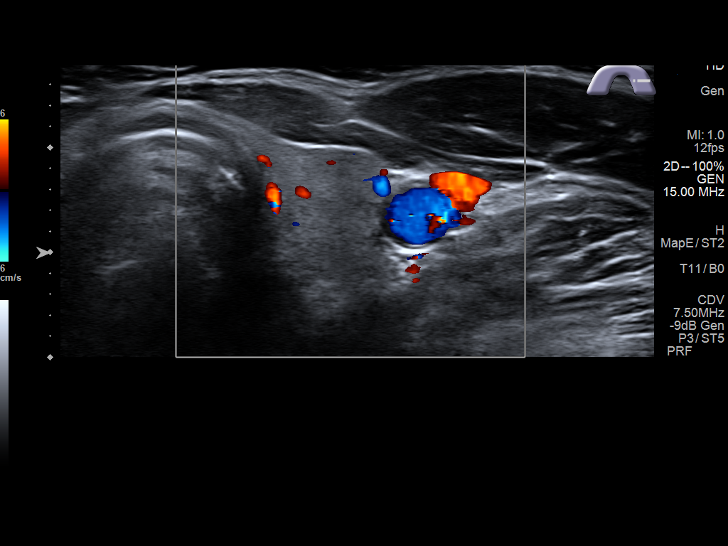
[im 39/58]
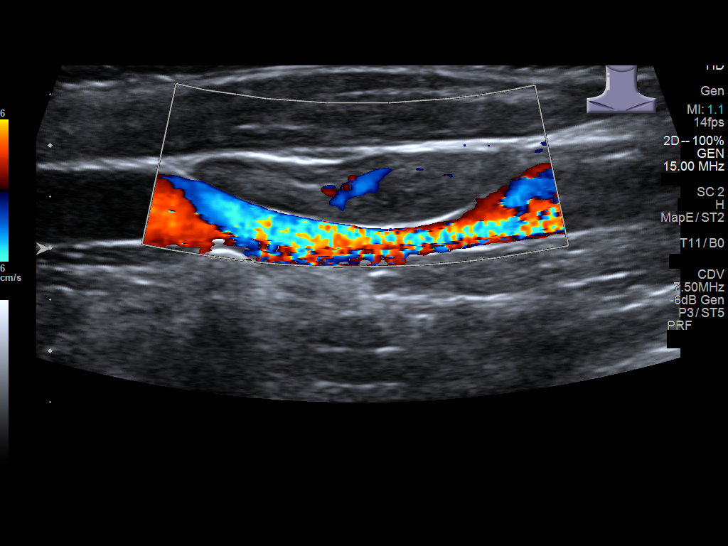
[im 43/58]
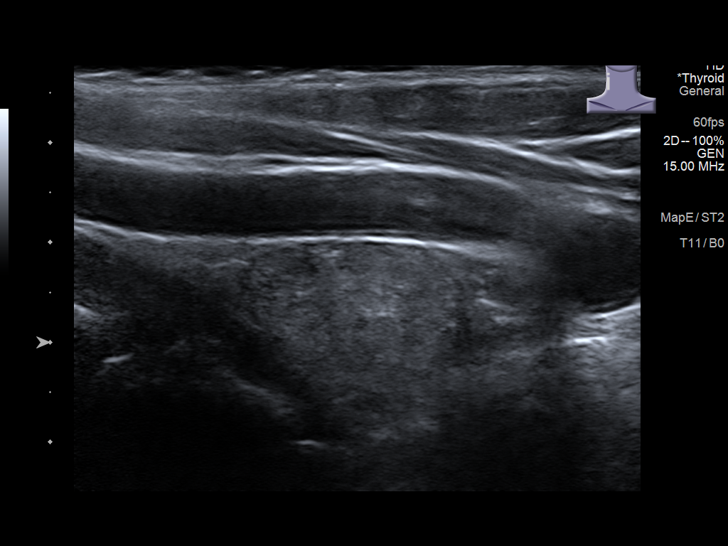
[im 48/58]
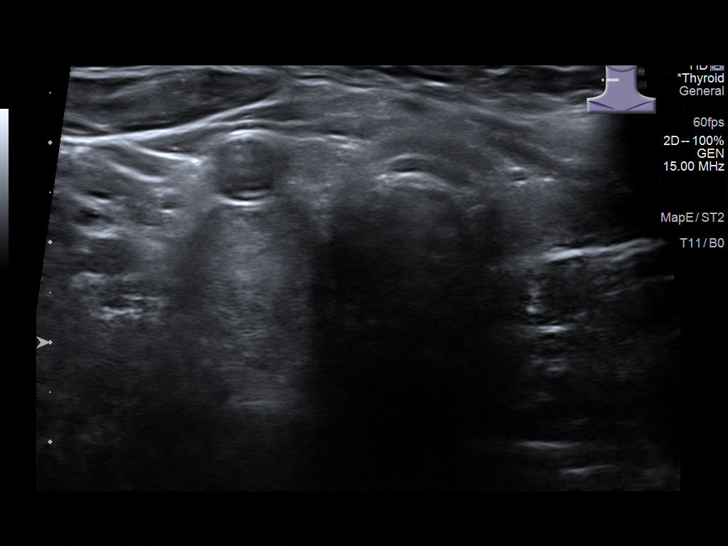
[im 53/58]
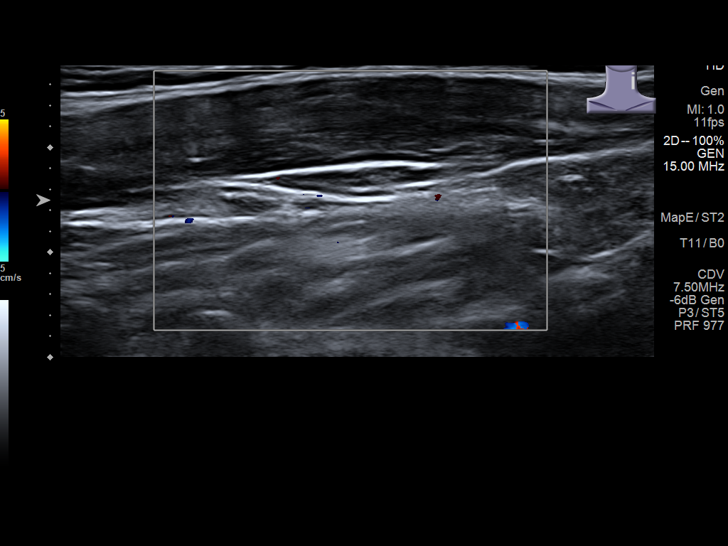
[im 58/58]
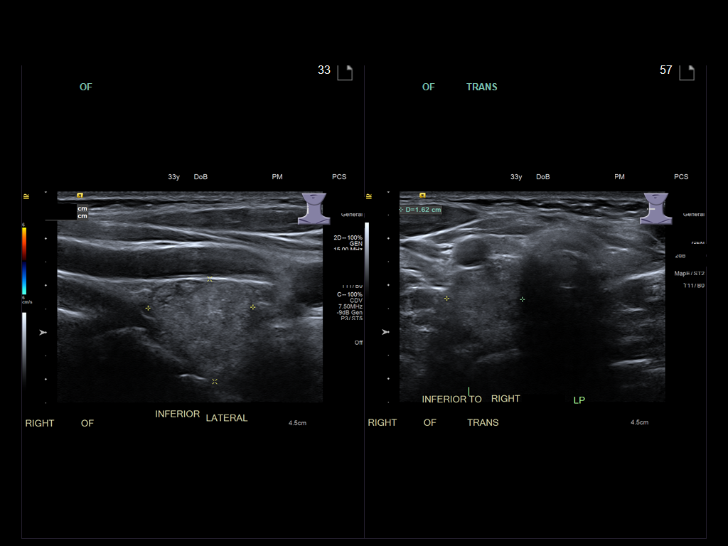

[13 of 25 positions shown; findings below may reference images not displayed]

FINDINGS: Parenchymal Echotexture: Normal

Isthmus: 3 mm

Right lobe: 4.9 x 1.6 x 1.1 cm

Left lobe: 5.4 x 1.5 x 1.1 cm

_________________________________________________________

Estimated total number of nodules >/= 1 cm: 1

Number of spongiform nodules >/=  2 cm not described below (TR1): 0

Number of mixed cystic and solid nodules >/= 1.5 cm not described
below (TR2): 0

_________________________________________________________

Nodule # 1:

Location: Right; Inferior

Maximum size: 2.2 cm; Other 2 dimensions: 2.2 x 1.6 cm

Composition: solid/almost completely solid (2)

Echogenicity: isoechoic (1)

Shape: not taller-than-wide (0)

Margins: extra-thyroidal extension (3)

Echogenic foci: none (0)

ACR TI-RADS total points: 6.

ACR TI-RADS risk category: TR4 (4-6 points).

ACR TI-RADS recommendations:

**Given size (>/= 1.5 cm) and appearance, fine needle aspiration of
this moderately suspicious nodule should be considered based on
TI-RADS criteria.

_________________________________________________________

Normal thyroid echotexture. No hypervascularity. Prominent elongated
right cervical lymph node measuring 7 mm in short axis, nonspecific.
Small benign left cervical nodes also noted.
IMPRESSION: 2.2 cm right inferior TR 4 nodule meets criteria for biopsy as
above.

The above is in keeping with the ACR TI-RADS recommendations - [HOSPITAL] 8980;[DATE].

## 2021-06-20 ENCOUNTER — Emergency Department (EMERGENCY_DEPARTMENT_HOSPITAL)
Admission: EM | Admit: 2021-06-20 | Discharge: 2021-06-21 | Disposition: A | Discharge status: Other Acute Inpatient Hospital | Payer: No Typology Code available for payment source | Source: Home / Self Care | Attending: Emergency Medicine | Admitting: Emergency Medicine

## 2021-06-20 ENCOUNTER — Encounter: Payer: Self-pay | Admitting: *Deleted

## 2021-06-20 ENCOUNTER — Other Ambulatory Visit: Payer: Self-pay

## 2021-06-20 DIAGNOSIS — F1520 Other stimulant dependence, uncomplicated: Secondary | ICD-10-CM

## 2021-06-20 DIAGNOSIS — J45909 Unspecified asthma, uncomplicated: Secondary | ICD-10-CM | POA: Insufficient documentation

## 2021-06-20 DIAGNOSIS — F4310 Post-traumatic stress disorder, unspecified: Secondary | ICD-10-CM | POA: Insufficient documentation

## 2021-06-20 DIAGNOSIS — Z20822 Contact with and (suspected) exposure to covid-19: Secondary | ICD-10-CM | POA: Insufficient documentation

## 2021-06-20 DIAGNOSIS — F1910 Other psychoactive substance abuse, uncomplicated: Secondary | ICD-10-CM | POA: Insufficient documentation

## 2021-06-20 DIAGNOSIS — F4323 Adjustment disorder with mixed anxiety and depressed mood: Secondary | ICD-10-CM | POA: Insufficient documentation

## 2021-06-20 DIAGNOSIS — F6089 Other specific personality disorders: Secondary | ICD-10-CM | POA: Insufficient documentation

## 2021-06-20 DIAGNOSIS — F17210 Nicotine dependence, cigarettes, uncomplicated: Secondary | ICD-10-CM | POA: Insufficient documentation

## 2021-06-20 DIAGNOSIS — F332 Major depressive disorder, recurrent severe without psychotic features: Secondary | ICD-10-CM | POA: Diagnosis present

## 2021-06-20 DIAGNOSIS — R45851 Suicidal ideations: Secondary | ICD-10-CM

## 2021-06-20 DIAGNOSIS — F1120 Opioid dependence, uncomplicated: Secondary | ICD-10-CM | POA: Diagnosis present

## 2021-06-20 LAB — URINE DRUG SCREEN, QUALITATIVE (ARMC ONLY)
Amphetamines, Ur Screen: POSITIVE — AB
Barbiturates, Ur Screen: NOT DETECTED
Benzodiazepine, Ur Scrn: NOT DETECTED
Cannabinoid 50 Ng, Ur ~~LOC~~: POSITIVE — AB
Cocaine Metabolite,Ur ~~LOC~~: NOT DETECTED
MDMA (Ecstasy)Ur Screen: NOT DETECTED
Methadone Scn, Ur: NOT DETECTED
Opiate, Ur Screen: NOT DETECTED
Phencyclidine (PCP) Ur S: NOT DETECTED
Tricyclic, Ur Screen: NOT DETECTED

## 2021-06-20 LAB — CBC
HCT: 41.5 % (ref 36.0–46.0)
Hemoglobin: 14.4 g/dL (ref 12.0–15.0)
MCH: 31.4 pg (ref 26.0–34.0)
MCHC: 34.7 g/dL (ref 30.0–36.0)
MCV: 90.4 fL (ref 80.0–100.0)
Platelets: 229 10*3/uL (ref 150–400)
RBC: 4.59 MIL/uL (ref 3.87–5.11)
RDW: 13 % (ref 11.5–15.5)
WBC: 15.3 10*3/uL — ABNORMAL HIGH (ref 4.0–10.5)
nRBC: 0 % (ref 0.0–0.2)

## 2021-06-20 LAB — COMPREHENSIVE METABOLIC PANEL
ALT: 16 U/L (ref 0–44)
AST: 16 U/L (ref 15–41)
Albumin: 4.1 g/dL (ref 3.5–5.0)
Alkaline Phosphatase: 58 U/L (ref 38–126)
Anion gap: 8 (ref 5–15)
BUN: 16 mg/dL (ref 6–20)
CO2: 23 mmol/L (ref 22–32)
Calcium: 8.8 mg/dL — ABNORMAL LOW (ref 8.9–10.3)
Chloride: 105 mmol/L (ref 98–111)
Creatinine, Ser: 0.78 mg/dL (ref 0.44–1.00)
GFR, Estimated: >60 mL/min (ref >60)
Glucose, Bld: 120 mg/dL — ABNORMAL HIGH (ref 70–99)
Potassium: 3.9 mmol/L (ref 3.5–5.1)
Sodium: 136 mmol/L (ref 135–145)
Total Bilirubin: 0.7 mg/dL (ref 0.3–1.2)
Total Protein: 7.2 g/dL (ref 6.5–8.1)

## 2021-06-20 LAB — SALICYLATE LEVEL: Salicylate Lvl: <7 mg/dL — ABNORMAL LOW (ref 7.0–30.0)

## 2021-06-20 LAB — ETHANOL: Alcohol, Ethyl (B): <10 mg/dL (ref <10)

## 2021-06-20 LAB — ACETAMINOPHEN LEVEL: Acetaminophen (Tylenol), Serum: <10 ug/mL — ABNORMAL LOW (ref 10–30)

## 2021-06-20 LAB — POC URINE PREG, ED: Preg Test, Ur: NEGATIVE

## 2021-06-20 LAB — RESP PANEL BY RT-PCR (FLU A&B, COVID) ARPGX2
Influenza A by PCR: NEGATIVE
Influenza B by PCR: NEGATIVE
SARS Coronavirus 2 by RT PCR: NEGATIVE

## 2021-06-20 MED ORDER — LORAZEPAM 1 MG PO TABS
1.0000 mg | ORAL_TABLET | Freq: Once | ORAL | Status: AC
  Administered 2021-06-20: 1 mg via ORAL
  Filled 2021-06-20: qty 1

## 2021-06-20 NOTE — ED Triage Notes (Signed)
Pt says she "does not feel safe and I can't keep myself safe" When asked what this means, she says "like I am going to end it". Reports she would use a knife to harm herself. Pt reports behavioral history, suppose to be on medications, but "cannot afford them".

## 2021-06-20 NOTE — ED Notes (Addendum)
Pt dressed in burgundy scrubs.  Pts belongings include tshirt, bra, sweatpants, underwear, socks and sneakers and a hair tie.  Pt sates she left her phone at home and took all her piercings out before coming here.  Pt very cooperative.  Ambulated to 20 H.  1 belonging bag

## 2021-06-20 NOTE — ED Provider Notes (Signed)
[facility] Emergency Department Provider Note   ____________________________________________   Event Date/Time   First MD Initiated Contact with Patient 06/20/21 2140     (approximate)  I have reviewed the triage vital signs and the nursing notes.   HISTORY  Chief Complaint No chief complaint on file.    HPI Diane Powell is a 35 y.o. female presents ER for concerns of suicidal ideation  Patient reports that she lost a child at about 20 weeks of pregnancy roughly a year ago.  She says that for the last 6 months she has been dwelling on that.  In order to help with her symptoms she abuses oxycodone, she will take Percocet up to 30 mg daily.  Tells me when we do a drug screen here that she will find marijuana as well as opioids.  She does monitor her Tylenol intake and does not take high levels of Tylenol, only takes up to 2 oxycodone pills at a time  She does not use alcohol  She reports that she feels very suicidal, she does not have a plan but she definitely knows that if she had a knife she could use it to stab her self.  She reports she cannot afford follow-up care.  She feels hopeless.  Denies pregnancy today.  Distant miscarriage but bothers her frequently.  Told her boyfriend and she reports her boyfriend is also concerned that she could be at risk of suicide  Past Medical History:  Diagnosis Date   Asthma    Depression    Migraines    Miscarriage     Patient Active Problem List   Diagnosis Date Noted   Missed abortion with fetal demise before 27 completed weeks of gestation 07/05/2020   PTSD (post-traumatic stress disorder) 10/16/2018   Cluster B personality disorder ([place]) 10/16/2018   Cannabis use disorder, moderate, dependence ([place]) 10/15/2018   Amphetamine use disorder, severe ([place]) 10/15/2018   Major depressive disorder, recurrent severe without psychotic features ([place]) 10/15/2018   Suicidal ideation 10/15/2018    Past Surgical  History:  Procedure Laterality Date   CYST REMOVAL TRUNK     TONSILLECTOMY      Prior to Admission medications   Medication Sig Start Date End Date Taking? Authorizing Provider  Prenatal Vit-Fe Fumarate-FA (PRENATAL MULTIVITAMIN) TABS tablet Take 1 tablet by mouth daily at 12 noon.    [provider]    Allergies Other  Family History  Problem Relation Age of Onset   Endometriosis Mother    Diabetes Father     Social History Social History   Tobacco Use   Smoking status: Every Day    Packs/day: 0.25    Pack years: 0.00    Types: Cigarettes   Smokeless tobacco: Never  Substance Use Topics   Alcohol use: No    Alcohol/week: 0.0 standard drinks   Drug use: Yes    Frequency: 6.0 times per week    Types: Marijuana    Comment: MJ use yesterday 07/04/20    Review of Systems Constitutional: No fever/chills ENT: No sore throat. Cardiovascular: Denies chest pain. Respiratory: Denies shortness of breath. Gastrointestinal: No abdominal pain.   Genitourinary: Negative for dysuria. Musculoskeletal: Negative for back pain. Skin: Negative for rash. Neurological: Negative for headaches.    ____________________________________________   PHYSICAL EXAM:  VITAL SIGNS: ED Triage Vitals  Enc Vitals Group     BP 06/20/21 2119 136/90     Pulse Rate 06/20/21 2119 (!) 114  Resp 06/20/21 2119 18     Temp 06/20/21 2119 98.5 F (36.9 C)     Temp Source 06/20/21 2119 Oral     SpO2 06/20/21 2119 99 %     Weight 06/20/21 2119 175 lb (79.4 kg)     Height 06/20/21 2119 5\' 5"  (1.651 m)     Head Circumference --      Peak Flow --      Pain Score 06/20/21 2128 0     Pain Loc --      Pain Edu? --      Excl. in [place]? --     Constitutional: Alert and oriented.  Tearful, anxious. Eyes: Conjunctivae are normal.  Patient is crying. Head: Atraumatic. Nose: No congestion/rhinnorhea. Mouth/Throat: Mucous membranes are moist. Neck: No stridor.  Cardiovascular: Normal rate,  regular rhythm. Good peripheral circulation. Respiratory: Normal respiratory effort.  Speaks full clear sentences no retractions.  Gastrointestinal: Soft and nontender. No distention. Musculoskeletal: No lower extremity tenderness nor edema. Neurologic:  Normal speech and language. No gross focal neurologic deficits are appreciated.  Skin:  Skin is warm, dry and intact. No rash noted. Psychiatric: Mood and affect are sad, seems to do well on the death from a miscarriage.  ____________________________________________   LABS (all labs ordered are listed, but only abnormal results are displayed)  Labs Reviewed  COMPREHENSIVE METABOLIC PANEL - Abnormal; Notable for the following components:      Result Value   Glucose, Bld 120 (*)    Calcium 8.8 (*)    All other components within normal limits  SALICYLATE LEVEL - Abnormal; Notable for the following components:   Salicylate Lvl <5.2 (*)    All other components within normal limits  ACETAMINOPHEN LEVEL - Abnormal; Notable for the following components:   Acetaminophen (Tylenol), Serum <10 (*)    All other components within normal limits  CBC - Abnormal; Notable for the following components:   WBC 15.3 (*)    All other components within normal limits  URINE DRUG SCREEN, QUALITATIVE ([facility] ONLY) - Abnormal; Notable for the following components:   Amphetamines, Ur Screen POSITIVE (*)    Cannabinoid 50 Ng, Ur Amanda POSITIVE (*)    All other components within normal limits  RESP PANEL BY RT-PCR (FLU A&B, COVID) ARPGX2  ETHANOL  POC URINE PREG, ED  POC URINE PREG, ED   ____________________________________________  EKG   ____________________________________________  RADIOLOGY   ____________________________________________   PROCEDURES  Procedure(s) performed: None  Procedures  Critical Care performed: No  ____________________________________________   INITIAL IMPRESSION / ASSESSMENT AND PLAN / ED COURSE  Pertinent labs &  imaging results that were available during my care of the patient were reviewed by me and considered in my medical decision making (see chart for details).   Placed under involuntary commitment due to concerns of active suicidal ideation and appropriate precautions.  She has not taken any action to commit suicide but seems to have high concern that she feels suicidal.  Psychiatry consult placed  The patient denies any acute medical illness.  She has a very reassuring clinical exam.  Of note on her labs she has mild leukocytosis this seems to be nonspecific and in the setting of having no symptoms of illness or fever I find that look unlikely the patient has evidence of acute infection  ----------------------------------------- 10:33 PM on 06/20/2021 ----------------------------------------- Patient's drug screen turns positive for amphetamines, he reports she is believes she is using oxycodone, but her opiate screen is negative.  Nonetheless the patient appears to be a polysubstance abuser with severe depressive symptoms.  We will observe her and have psychiatry evaluate.  The patient is medically cleared at this point for psychiatric evaluation  The patient has been placed in psychiatric observation due to the need to provide a safe environment for the patient while obtaining psychiatric consultation and evaluation, as well as ongoing medical and medication management to treat the patient's condition.  The patient has been placed under full IVC at this time.     Ongoing ED care assigned to Dr. Beather Arbour  Vitals:   06/20/21 2119 06/20/21 2245  BP: 136/90 105/67  Pulse: (!) 114 79  Resp: 18 16  Temp: 98.5 F (36.9 C) 98.4 F (36.9 C)  SpO2: 99% 95%     ____________________________________________   FINAL CLINICAL IMPRESSION(S) / ED DIAGNOSES  Final diagnoses:  Polysubstance abuse ([place])  Suicidal ideation  Adjustment disorder with mixed anxiety and depressed mood        Note:   This document was prepared using Dragon voice recognition software and may include unintentional dictation errors       Delman Kitten, MD 06/21/21 0010

## 2021-06-21 ENCOUNTER — Inpatient Hospital Stay
Admission: RE | Admit: 2021-06-21 | Discharge: 2021-06-22 | DRG: 885 | Disposition: A | Discharge status: Home / Self Care | Payer: No Typology Code available for payment source | Source: Intra-hospital | Attending: Behavioral Health | Admitting: Behavioral Health

## 2021-06-21 ENCOUNTER — Encounter: Payer: Self-pay | Admitting: Behavioral Health

## 2021-06-21 ENCOUNTER — Other Ambulatory Visit: Payer: Self-pay

## 2021-06-21 DIAGNOSIS — F17210 Nicotine dependence, cigarettes, uncomplicated: Secondary | ICD-10-CM | POA: Diagnosis present

## 2021-06-21 DIAGNOSIS — F6089 Other specific personality disorders: Secondary | ICD-10-CM | POA: Diagnosis present

## 2021-06-21 DIAGNOSIS — F4310 Post-traumatic stress disorder, unspecified: Secondary | ICD-10-CM | POA: Diagnosis present

## 2021-06-21 DIAGNOSIS — F1220 Cannabis dependence, uncomplicated: Secondary | ICD-10-CM | POA: Diagnosis present

## 2021-06-21 DIAGNOSIS — F332 Major depressive disorder, recurrent severe without psychotic features: Secondary | ICD-10-CM

## 2021-06-21 DIAGNOSIS — Z20822 Contact with and (suspected) exposure to covid-19: Secondary | ICD-10-CM | POA: Diagnosis present

## 2021-06-21 DIAGNOSIS — R45851 Suicidal ideations: Secondary | ICD-10-CM | POA: Diagnosis present

## 2021-06-21 DIAGNOSIS — F1120 Opioid dependence, uncomplicated: Secondary | ICD-10-CM | POA: Diagnosis present

## 2021-06-21 DIAGNOSIS — Z9151 Personal history of suicidal behavior: Secondary | ICD-10-CM

## 2021-06-21 MED ORDER — CLONIDINE HCL 0.1 MG PO TABS
0.1000 mg | ORAL_TABLET | Freq: Every day | ORAL | Status: DC
Start: 2021-06-26 — End: 2021-06-22

## 2021-06-21 MED ORDER — MAGNESIUM HYDROXIDE 400 MG/5ML PO SUSP: 30.0000 mL | Freq: Every day | ORAL | Status: DC | PRN

## 2021-06-21 MED ORDER — DICYCLOMINE HCL 20 MG PO TABS
20.0000 mg | ORAL_TABLET | Freq: Four times a day (QID) | ORAL | Status: DC | PRN
  Filled 2021-06-21: qty 1

## 2021-06-21 MED ORDER — CLONIDINE HCL 0.1 MG PO TABS: 0.1000 mg | ORAL_TABLET | Freq: Four times a day (QID) | ORAL | Status: DC

## 2021-06-21 MED ORDER — HYDROXYZINE HCL 25 MG PO TABS: 25.0000 mg | ORAL_TABLET | Freq: Four times a day (QID) | ORAL | Status: DC | PRN

## 2021-06-21 MED ORDER — CLONIDINE HCL 0.1 MG PO TABS: 0.1000 mg | ORAL_TABLET | ORAL | Status: DC

## 2021-06-21 MED ORDER — NAPROXEN 500 MG PO TABS: 500.0000 mg | ORAL_TABLET | Freq: Two times a day (BID) | ORAL | Status: DC | PRN

## 2021-06-21 MED ORDER — ACETAMINOPHEN 325 MG PO TABS: 650.0000 mg | ORAL_TABLET | Freq: Four times a day (QID) | ORAL | Status: DC | PRN

## 2021-06-21 MED ORDER — CLONIDINE HCL 0.1 MG PO TABS: 0.1000 mg | ORAL_TABLET | Freq: Every day | ORAL | Status: DC

## 2021-06-21 MED ORDER — NAPROXEN 500 MG PO TABS
500.0000 mg | ORAL_TABLET | Freq: Two times a day (BID) | ORAL | Status: DC | PRN
  Filled 2021-06-21: qty 1

## 2021-06-21 MED ORDER — ONDANSETRON 4 MG PO TBDP: 4.0000 mg | ORAL_TABLET | Freq: Four times a day (QID) | ORAL | Status: DC | PRN

## 2021-06-21 MED ORDER — LOPERAMIDE HCL 2 MG PO CAPS: 2.0000 mg | ORAL_CAPSULE | ORAL | Status: DC | PRN

## 2021-06-21 MED ORDER — HYDROXYZINE HCL 50 MG PO TABS
50.0000 mg | ORAL_TABLET | Freq: Three times a day (TID) | ORAL | Status: DC | PRN
  Administered 2021-06-21: 50 mg via ORAL
  Filled 2021-06-21: qty 1

## 2021-06-21 MED ORDER — GABAPENTIN 300 MG PO CAPS: 300.0000 mg | ORAL_CAPSULE | Freq: Three times a day (TID) | ORAL | Status: DC

## 2021-06-21 MED ORDER — ALUM & MAG HYDROXIDE-SIMETH 200-200-20 MG/5ML PO SUSP: 30.0000 mL | ORAL | Status: DC | PRN

## 2021-06-21 MED ORDER — METHOCARBAMOL 500 MG PO TABS: 500.0000 mg | ORAL_TABLET | Freq: Three times a day (TID) | ORAL | Status: DC | PRN

## 2021-06-21 MED ORDER — METHOCARBAMOL 500 MG PO TABS
500.0000 mg | ORAL_TABLET | Freq: Three times a day (TID) | ORAL | Status: DC | PRN
  Filled 2021-06-21: qty 1

## 2021-06-21 MED ORDER — GABAPENTIN 300 MG PO CAPS
300.0000 mg | ORAL_CAPSULE | Freq: Three times a day (TID) | ORAL | Status: DC
  Administered 2021-06-21 – 2021-06-22 (×3): 300 mg via ORAL
  Filled 2021-06-21 (×3): qty 1

## 2021-06-21 MED ORDER — DICYCLOMINE HCL 20 MG PO TABS: 20.0000 mg | ORAL_TABLET | Freq: Four times a day (QID) | ORAL | Status: DC | PRN

## 2021-06-21 MED ORDER — SERTRALINE HCL 25 MG PO TABS
50.0000 mg | ORAL_TABLET | Freq: Every day | ORAL | Status: DC
  Administered 2021-06-21 – 2021-06-22 (×2): 50 mg via ORAL
  Filled 2021-06-21 (×2): qty 2

## 2021-06-21 MED ORDER — CLONIDINE HCL 0.1 MG PO TABS
0.1000 mg | ORAL_TABLET | Freq: Four times a day (QID) | ORAL | Status: DC
  Administered 2021-06-21 – 2021-06-22 (×4): 0.1 mg via ORAL
  Filled 2021-06-21 (×4): qty 1

## 2021-06-21 MED ORDER — ONDANSETRON 4 MG PO TBDP
4.0000 mg | ORAL_TABLET | Freq: Four times a day (QID) | ORAL | Status: DC | PRN
  Administered 2021-06-21 – 2021-06-22 (×2): 4 mg via ORAL
  Filled 2021-06-21 (×2): qty 1
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
## 2021-06-21 NOTE — BHH Suicide Risk Assessment (Signed)
[facility] Admission Suicide Risk Assessment   Nursing information obtained from:  Patient Demographic factors:  Caucasian Current Mental Status:  NA Loss Factors:  NA Historical Factors:  Anniversary of important loss Risk Reduction Factors:  Living with another person, especially a relative  Total Time spent with patient: 1 hour Principal Problem: Major depressive disorder, recurrent severe without psychotic features ([place]) Diagnosis:  Principal Problem:   Major depressive disorder, recurrent severe without psychotic features ([place]) Active Problems:   Cannabis use disorder, moderate, dependence ([facility])   PTSD (post-traumatic stress disorder)   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  Patient notes that this is the anniversary of a miscarriage she had at 20 weeks and that she has been having a hard time.  She has been feeling depressed this entire past year she was previously seeing a counselor and a therapist until she can no longer afford to do so.  She was on Zoloft and hydroxyzine which she felt were helpful for her mood, but has not been on them since she is unable to see her psychiatrist.  She has emotional support from her mother, older sister, husband, and emotional support dog Dixie.  She notes that she wants help in the form of outpatient follow-up.  She requests discharge stating that the hospital make her feel worse.  Patient very upset when informed she could not leave today and asked for me to leave her room.  Patient was having suicidal ideations as of this morning.  She states that she has no cuts today when asked about suicidal ideations this afternoon.  She denies any homicidal ideations, visual hallucinations, auditory hallucinations.  Continued Clinical Symptoms:  Alcohol Use Disorder Identification Test Final Score (AUDIT): 1 The "Alcohol Use  Disorders Identification Test", Guidelines for Use in Primary Care, Second Edition.  World Pharmacologist [facility]). Score between 0-7:  no or low risk or alcohol related problems. Score between 8-15:  moderate risk of alcohol related problems. Score between 16-19:  high risk of alcohol related problems. Score 20 or above:  warrants further diagnostic evaluation for alcohol dependence and treatment.   CLINICAL FACTORS:   Severe Anxiety and/or Agitation Depression:   Comorbid alcohol abuse/dependence Hopelessness Impulsivity Severe Unstable or Poor Therapeutic Relationship Previous Psychiatric Diagnoses and Treatments   Musculoskeletal: Strength & Muscle Tone: within normal limits Gait & Station: normal Patient leans: N/A  Psychiatric Specialty Exam:  Presentation  General Appearance: Casual  Eye Contact:Fleeting  Speech:Normal Rate  Speech Volume:Normal  Handedness:Right   Mood and Affect  Mood:Anxious; Depressed  Affect:Congruent; Tearful   Thought Process  Thought Processes:Goal Directed  Descriptions of Associations:Intact  Orientation:Full (Time, Place and Person)  Thought Content:Logical  History of Schizophrenia/Schizoaffective disorder:No  Duration of Psychotic Symptoms:No data recorded Hallucinations:Hallucinations: None  Ideas of Reference:None  Suicidal Thoughts:Suicidal Thoughts: No  Homicidal Thoughts:Homicidal Thoughts: No   Sensorium  Memory:Immediate Fair; Recent Fair; Remote Fair  Judgment:Intact  Insight:Present   Executive Functions  Concentration:Fair  Attention Span:Fair  [place]   Psychomotor Activity  Psychomotor Activity:Psychomotor Activity: Normal   Assets  Assets:Communication Skills; Desire for Improvement; Financial Resources/Insurance; Housing; Intimacy; Leisure Time; Physical Health; Social Support   Sleep  Sleep:Sleep: Fair    Physical  Exam: Physical Exam ROS Blood pressure 123/79, pulse 72, temperature 98.3 F (36.8 C), temperature source Oral, resp. rate  16, height 5\' 5"  (1.651 m), weight 72.1 kg, last menstrual period 05/26/2021, SpO2 100 %. Body mass index is 26.46 kg/m.   COGNITIVE FEATURES THAT CONTRIBUTE TO RISK:  Polarized thinking    SUICIDE RISK:   Mild:  Suicidal ideation of limited frequency, intensity, duration, and specificity.  There are no identifiable plans, no associated intent, mild dysphoria and related symptoms, good self-control (both objective and subjective assessment), few other risk factors, and identifiable protective factors, including available and accessible social support.  PLAN OF CARE: Continue inpatient hospitalization, see H&P for details.   I certify that inpatient services furnished can reasonably be expected to improve the patient's condition.   Salley Scarlet, MD 06/21/2021, 3:03 PM

## 2021-06-21 NOTE — ED Notes (Signed)
Pts breakfast placed at bedside; pt still remains sleeping at this time.

## 2021-06-21 NOTE — BH Assessment (Signed)
Patient is to be admitted to [facility] BMU today 06/21/21 by Dr.  Domingo Cocking .  Attending Physician will be Dr. Domingo Cocking.   Patient has been assigned to room 304, by [place]lin[place], Junita Push.    Merton Border, ER Secretary   Dr. Tamala Julian, ER MD  Radonna Ricker, Patient's Nurse  Elberta Fortis, Patient Access.

## 2021-06-21 NOTE — ED Notes (Signed)
Psychiatrist at bedside

## 2021-06-21 NOTE — ED Notes (Addendum)
Pt requesting shower, in shower currently.

## 2021-06-21 NOTE — ED Notes (Signed)
Out of shower. Pt sent to BMU with 1 bag of belongings, IVC paperwork via wheelchair escorted by ED Tommy Medal and [employer] security

## 2021-06-21 NOTE — Progress Notes (Signed)
Patient refused any PRN medications at this time. Had dinner. Encouraged fluids.

## 2021-06-21 NOTE — H&P (Signed)
Psychiatric Admission Assessment Adult  Patient Identification: Diane Powell MRN:  [id number] Date of Evaluation:  06/21/2021 Chief Complaint:  Major depressive disorder, recurrent severe without psychotic features ([place]) [F33.2] Principal Diagnosis: Major depressive disorder, recurrent severe without psychotic features ([place]) Diagnosis:  Principal Problem:   Major depressive disorder, recurrent severe without psychotic features ([place]) Active Problems:   Cannabis use disorder, moderate, dependence ([place])   PTSD (post-traumatic stress disorder)   Cluster B personality disorder ([place])  CC "I want to go home."  History of Present Illness: 35 year old female who presents for worsening depression and suicidal ideations with a plan to a use a knife to end her life.  Patient notes that this is the anniversary of a miscarriage she had at 20 weeks and that she has been having a hard time.  She has been feeling depressed this entire past year she was previously seeing a counselor and a therapist until she can no longer afford to do so.  She was on Zoloft and hydroxyzine which she felt were helpful for her mood, but has not been on them since she is unable to see her psychiatrist.  She has emotional support from her mother, older sister, husband, and emotional support dog Dixie.  She notes that she wants help in the form of outpatient follow-up.  She requests discharge stating that the hospital make her feel worse.  Patient very upset when informed she could not leave today and asked for me to leave her room.  Patient was having suicidal ideations as of this morning.  She states that she has no cuts today when asked about suicidal ideations this afternoon.  She denies any homicidal ideations, visual hallucinations, auditory hallucinations.  Associated Signs/Symptoms: Depression Symptoms:  depressed mood, anhedonia, feelings of worthlessness/guilt, difficulty concentrating, hopelessness, recurrent thoughts  of death, suicidal thoughts with specific plan, disturbed sleep, Duration of Depression Symptoms: Greater than two weeks  (Hypo) Manic Symptoms:  Impulsivity, Anxiety Symptoms:  Excessive Worry, Psychotic Symptoms:   Denies PTSD Symptoms: History of PTSD, declines to discuss today Total Time spent with patient: 1 hour  Past Psychiatric History: 1 prior hospitalization 3 years ago after she attempted suicide.  Was previously seeing an outpatient provider, but they have stopped excepting her Medicaid.  Previously did well on Zoloft and hydroxyzine.  Is the patient at risk to self? Yes.    Has the patient been a risk to self in the past 6 months? No.  Has the patient been a risk to self within the distant past? Yes.    Is the patient a risk to others? No.  Has the patient been a risk to others in the past 6 months? No.  Has the patient been a risk to others within the distant past? No.   Prior Inpatient Therapy:   Prior Outpatient Therapy:    Alcohol Screening: 1. How often do you have a drink containing alcohol?: Monthly or less 2. How many drinks containing alcohol do you have on a typical day when you are drinking?: 1 or 2 3. How often do you have six or more drinks on one occasion?: Never AUDIT-C Score: 1 4. How often during the last year have you found that you were not able to stop drinking once you had started?: Never 5. How often during the last year have you failed to do what was normally expected from you because of drinking?: Never 6. How often during the last year have you needed a first drink in the morning  to get yourself going after a heavy drinking session?: Never 7. How often during the last year have you had a feeling of guilt of remorse after drinking?: Never 8. How often during the last year have you been unable to remember what happened the night before because you had been drinking?: Never 9. Have you or someone else been injured as a result of your drinking?:  No 10. Has a relative or friend or a doctor or another health worker been concerned about your drinking or suggested you cut down?: No Alcohol Use Disorder Identification Test Final Score (AUDIT): 1 Substance Abuse History in the last 12 months:  Yes.   Consequences of Substance Abuse: Worsening mental health Previous Psychotropic Medications: Yes  Psychological Evaluations: Yes  Past Medical History:  Past Medical History:  Diagnosis Date   Asthma    Depression    Migraines    Miscarriage     Past Surgical History:  Procedure Laterality Date   CYST REMOVAL TRUNK     TONSILLECTOMY     Family History:  Family History  Problem Relation Age of Onset   Endometriosis Mother    Diabetes Father    Family Psychiatric  History: Denies Tobacco Screening:   Social History:  Social History   Substance and Sexual Activity  Alcohol Use No   Alcohol/week: 0.0 standard drinks     Social History   Substance and Sexual Activity  Drug Use Yes   Frequency: 6.0 times per week   Types: Marijuana   Comment: MJ use yesterday 07/04/20    Additional Social History:                           Allergies:   Allergies  Allergen Reactions   Other     Pt states she has an allergy to a medication that starts with the letter Z. Pt states it was given IV and that she was very hot and itchy.    Lab Results:  Results for orders placed or performed during the hospital encounter of 06/20/21 (from the past 48 hour(s))  Comprehensive metabolic panel     Status: Abnormal   Collection Time: 06/20/21  9:20 PM  Result Value Ref Range   Sodium 136 135 - 145 mmol/L   Potassium 3.9 3.5 - 5.1 mmol/L   Chloride 105 98 - 111 mmol/L   CO2 23 22 - 32 mmol/L   Glucose, Bld 120 (H) 70 - 99 mg/dL    Comment: Glucose reference range applies only to samples taken after fasting for at least 8 hours.   BUN 16 6 - 20 mg/dL   Creatinine, Ser 0.78 0.44 - 1.00 mg/dL   Calcium 8.8 (L) 8.9 - 10.3 mg/dL    Total Protein 7.2 6.5 - 8.1 g/dL   Albumin 4.1 3.5 - 5.0 g/dL   AST 16 15 - 41 U/L   ALT 16 0 - 44 U/L   Alkaline Phosphatase 58 38 - 126 U/L   Total Bilirubin 0.7 0.3 - 1.2 mg/dL   GFR, Estimated >60 >60 mL/min    Comment: (NOTE) Calculated using the CKD-EPI Creatinine Equation (2021)    Anion gap 8 5 - 15    Comment: Performed at [facility], [address], [place], [place] [address]  Ethanol     Status: None   Collection Time: 06/20/21  9:20 PM  Result Value Ref Range   Alcohol, Ethyl (B) <10 <10 mg/dL  Comment: (NOTE) Lowest detectable limit for serum alcohol is 10 mg/dL.  For medical purposes only. Performed at [facility], [address] [place], [place] [address]   Salicylate level     Status: Abnormal   Collection Time: 06/20/21  9:20 PM  Result Value Ref Range   Salicylate Lvl <2.7 (L) 7.0 - 30.0 mg/dL    Comment: Performed at [facility], [address] [place], [place] [address]  Acetaminophen level     Status: Abnormal   Collection Time: 06/20/21  9:20 PM  Result Value Ref Range   Acetaminophen (Tylenol), Serum <10 (L) 10 - 30 ug/mL    Comment: (NOTE) Therapeutic concentrations vary significantly. A range of 10-30 ug/mL  may be an effective concentration for many patients. However, some  are best treated at concentrations outside of this range. Acetaminophen concentrations >150 ug/mL at 4 hours after ingestion  and >50 ug/mL at 12 hours after ingestion are often associated with  toxic reactions.  Performed at [facility], [address] [place], [place] [address]   cbc     Status: Abnormal   Collection Time: 06/20/21  9:20 PM  Result Value Ref Range   WBC 15.3 (H) 4.0 - 10.5 K/uL   RBC 4.59 3.87 - 5.11 MIL/uL   Hemoglobin 14.4 12.0 - 15.0 g/dL   HCT 41.5 36.0 - 46.0 %   MCV 90.4 80.0 - 100.0 fL   MCH 31.4 26.0 - 34.0 pg   MCHC 34.7 30.0 - 36.0 g/dL   RDW 13.0 11.5 - 15.5 %   Platelets 229 150 - 400  K/uL   nRBC 0.0 0.0 - 0.2 %    Comment: Performed at [facility], [address], [place],  [address]  Urine Drug Screen, Qualitative     Status: Abnormal   Collection Time: 06/20/21  9:20 PM  Result Value Ref Range   Tricyclic, Ur Screen NONE DETECTED NONE DETECTED   Amphetamines, Ur Screen POSITIVE (A) NONE DETECTED   MDMA (Ecstasy)Ur Screen NONE DETECTED NONE DETECTED   Cocaine Metabolite,Ur [place] NONE DETECTED NONE DETECTED   Opiate, Ur Screen NONE DETECTED NONE DETECTED   Phencyclidine (PCP) Ur S NONE DETECTED NONE DETECTED   Cannabinoid 50 Ng, Ur [place] POSITIVE (A) NONE DETECTED   Barbiturates, Ur Screen NONE DETECTED NONE DETECTED   Benzodiazepine, Ur Scrn NONE DETECTED NONE DETECTED   Methadone Scn, Ur NONE DETECTED NONE DETECTED    Comment: (NOTE) Tricyclics + metabolites, urine    Cutoff 1000 ng/mL Amphetamines + metabolites, urine  Cutoff 1000 ng/mL MDMA (Ecstasy), urine              Cutoff 500 ng/mL Cocaine Metabolite, urine          Cutoff 300 ng/mL Opiate + metabolites, urine        Cutoff 300 ng/mL Phencyclidine (PCP), urine         Cutoff 25 ng/mL Cannabinoid, urine                 Cutoff 50 ng/mL Barbiturates + metabolites, urine  Cutoff 200 ng/mL Benzodiazepine, urine              Cutoff 200 ng/mL Methadone, urine                   Cutoff 300 ng/mL  The urine drug screen provides only a preliminary, unconfirmed analytical test result and should not be used for non-medical purposes. Clinical consideration and professional  judgment should be applied to any positive drug screen result due to possible interfering substances. A more specific alternate chemical method must be used in order to obtain a confirmed analytical result. Gas chromatography / mass spectrometry (GC/MS) is the preferred confirm atory method. Performed at [facility], [address] [place], [place] [address]   POC urine preg, ED (not at [facility])     Status: None    Collection Time: 06/20/21  9:29 PM  Result Value Ref Range   Preg Test, Ur NEGATIVE NEGATIVE    Comment:        THE SENSITIVITY OF THIS METHODOLOGY IS >24 mIU/mL   Resp Panel by RT-PCR (Flu A&B, Covid) Nasopharyngeal Swab     Status: None   Collection Time: 06/20/21 10:38 PM   Specimen: Nasopharyngeal Swab; Nasopharyngeal(NP) swabs in vial transport medium  Result Value Ref Range   SARS Coronavirus 2 by RT PCR NEGATIVE NEGATIVE    Comment: (NOTE) SARS-CoV-2 target nucleic acids are NOT DETECTED.  The SARS-CoV-2 RNA is generally detectable in upper respiratory specimens during the acute phase of infection. The lowest concentration of SARS-CoV-2 viral copies this assay can detect is 138 copies/mL. A negative result does not preclude SARS-Cov-2 infection and should not be used as the sole basis for treatment or other patient management decisions. A negative result may occur with  improper specimen collection/handling, submission of specimen other than nasopharyngeal swab, presence of viral mutation(s) within the areas targeted by this assay, and inadequate number of viral copies(<138 copies/mL). A negative result must be combined with clinical observations, patient history, and epidemiological information. The expected result is Negative.  Fact Sheet for Patients:  EntrepreneurPulse.com.au  Fact Sheet for Healthcare Providers:  IncredibleEmployment.be  This test is no t yet approved or cleared by the [place] FDA and  has been authorized for detection and/or diagnosis of SARS-CoV-2 by FDA under an Emergency Use Authorization (EUA). This EUA will remain  in effect (meaning this test can be used) for the duration of the COVID-19 declaration under Section 564(b)(1) of the Act, 21 U.S.C.section 360bbb-3(b)(1), unless the authorization is terminated  or revoked sooner.       Influenza A by PCR NEGATIVE NEGATIVE   Influenza B by PCR NEGATIVE  NEGATIVE    Comment: (NOTE) The Xpert Xpress SARS-CoV-2/FLU/RSV plus assay is intended as an aid in the diagnosis of influenza from Nasopharyngeal swab specimens and should not be used as a sole basis for treatment. Nasal washings and aspirates are unacceptable for Xpert Xpress SARS-CoV-2/FLU/RSV testing.  Fact Sheet for Patients: EntrepreneurPulse.com.au  Fact Sheet for Healthcare Providers: IncredibleEmployment.be  This test is not yet approved or cleared by the [place] FDA and has been authorized for detection and/or diagnosis of SARS-CoV-2 by FDA under an Emergency Use Authorization (EUA). This EUA will remain in effect (meaning this test can be used) for the duration of the COVID-19 declaration under Section 564(b)(1) of the Act, 21 U.S.C. section 360bbb-3(b)(1), unless the authorization is terminated or revoked.  Performed at [facility], [address] [place], [place] [address]     Blood Alcohol level:  Lab Results  Component Value Date   [facility] <10 06/20/2021   ETH <10 08/67/6195    Metabolic Disorder Labs:  Lab Results  Component Value Date   HGBA1C 5.1 10/15/2018   MPG 99.67 10/15/2018   No results found for: PROLACTIN Lab Results  Component Value Date   CHOL 152 10/15/2018   TRIG 99  10/15/2018   HDL 32 (L) 10/15/2018   CHOLHDL 4.8 10/15/2018   VLDL 20 10/15/2018   LDLCALC 100 (H) 10/15/2018    Current Medications: Current Facility-Administered Medications  Medication Dose Route Frequency Provider Last Rate Last Admin   acetaminophen (TYLENOL) tablet 650 mg  650 mg Oral Q6H PRN Patrecia Pour, NP       alum & mag hydroxide-simeth (MAALOX/MYLANTA) 200-200-20 MG/5ML suspension 30 mL  30 mL Oral Q4H PRN Patrecia Pour, NP       cloNIDine (CATAPRES) tablet 0.1 mg  0.1 mg Oral QID Patrecia Pour, NP       Followed by   Derrill Memo ON 06/23/2021] cloNIDine (CATAPRES) tablet 0.1 mg  0.1 mg Oral BH-qamhs Patrecia Pour, NP       Followed by   Derrill Memo ON 06/26/2021] cloNIDine (CATAPRES) tablet 0.1 mg  0.1 mg Oral QAC breakfast Patrecia Pour, NP       dicyclomine (BENTYL) tablet 20 mg  20 mg Oral Q6H PRN Patrecia Pour, NP       gabapentin (NEURONTIN) capsule 300 mg  300 mg Oral TID Patrecia Pour, NP       hydrOXYzine (ATARAX/VISTARIL) tablet 25 mg  25 mg Oral Q6H PRN Patrecia Pour, NP       loperamide (IMODIUM) capsule 2-4 mg  2-4 mg Oral PRN Patrecia Pour, NP       magnesium hydroxide (MILK OF MAGNESIA) suspension 30 mL  30 mL Oral Daily PRN Patrecia Pour, NP       methocarbamol (ROBAXIN) tablet 500 mg  500 mg Oral Q8H PRN Patrecia Pour, NP       naproxen (NAPROSYN) tablet 500 mg  500 mg Oral BID PRN Patrecia Pour, NP       ondansetron (ZOFRAN-ODT) disintegrating tablet 4 mg  4 mg Oral Q6H PRN Patrecia Pour, NP       PTA Medications: Medications Prior to Admission  Medication Sig Dispense Refill Last Dose   Prenatal Vit-Fe Fumarate-FA (PRENATAL MULTIVITAMIN) TABS tablet Take 1 tablet by mouth daily at 12 noon.       Musculoskeletal: Strength & Muscle Tone: within normal limits Gait & Station: normal Patient leans: N/A            Psychiatric Specialty Exam:  Presentation  General Appearance: Casual  Eye Contact:Fleeting  Speech:Normal Rate  Speech Volume:Normal  Handedness:Right   Mood and Affect  Mood:Anxious; Depressed  Affect:Congruent; Tearful   Thought Process  Thought Processes:Goal Directed  Duration of Psychotic Symptoms: No data recorded Past Diagnosis of Schizophrenia or Psychoactive disorder: No  Descriptions of Associations:Intact  Orientation:Full (Time, Place and Person)  Thought Content:Logical  Hallucinations:Hallucinations: None  Ideas of Reference:None  Suicidal Thoughts:Suicidal Thoughts: No  Homicidal Thoughts:Homicidal Thoughts: No   Sensorium  Memory:Immediate Fair; Recent Fair; Remote  Fair  Judgment:Intact  Insight:Present   Executive Functions  Concentration:Fair  Attention Span:Fair  [place]   Psychomotor Activity  Psychomotor Activity:Psychomotor Activity: Normal   Assets  Assets:Communication Skills; Desire for Improvement; Financial Resources/Insurance; Housing; Intimacy; Leisure Time; Physical Health; Social Support   Sleep  Sleep:Sleep: Fair    Physical Exam: Physical Exam Vitals and nursing note reviewed.  Constitutional:      Appearance: Normal appearance.  HENT:     Head: Normocephalic and atraumatic.     Right Ear: External ear normal.     Left Ear: External ear normal.  Nose: Nose normal.     Mouth/Throat:     Mouth: Mucous membranes are moist.     Pharynx: Oropharynx is clear.  Eyes:     Extraocular Movements: Extraocular movements intact.     Conjunctiva/sclera: Conjunctivae normal.     Pupils: Pupils are equal, round, and reactive to light.  Cardiovascular:     Rate and Rhythm: Normal rate.     Pulses: Normal pulses.  Pulmonary:     Effort: Pulmonary effort is normal.     Breath sounds: Normal breath sounds.  Abdominal:     General: Abdomen is flat.     Palpations: Abdomen is soft.  Musculoskeletal:        General: No swelling. Normal range of motion.     Cervical back: Normal range of motion and neck supple.  Skin:    General: Skin is warm and dry.  Neurological:     General: No focal deficit present.     Mental Status: She is alert and oriented to person, place, and time.  Psychiatric:        Attention and Perception: Attention and perception normal.        Mood and Affect: Mood is anxious and depressed. Affect is tearful.        Speech: Speech normal.        Behavior: Behavior is agitated.        Thought Content: Thought content includes suicidal ideation. Thought content does not include homicidal ideation.        Cognition and Memory: Cognition and memory  normal.        Judgment: Judgment is impulsive.   Review of Systems  Constitutional: Negative.   HENT: Negative.    Eyes: Negative.   Respiratory: Negative.    Cardiovascular: Negative.   Gastrointestinal: Negative.   Genitourinary: Negative.   Musculoskeletal: Negative.   Skin: Negative.   Neurological: Negative.   Endo/Heme/Allergies:  Positive for environmental allergies. Does not bruise/bleed easily.  Psychiatric/Behavioral:  Positive for depression, substance abuse and suicidal ideas. Negative for hallucinations and memory loss. The patient is nervous/anxious. The patient does not have insomnia.   Blood pressure 123/79, pulse 72, temperature 98.3 F (36.8 C), temperature source Oral, resp. rate 16, height 5\' 5"  (1.651 m), weight 72.1 kg, last menstrual period 05/26/2021, SpO2 100 %. Body mass index is 26.46 kg/m.  Treatment Plan Summary: Daily contact with patient to assess and evaluate symptoms and progress in treatment and Medication management 35 year old female with history of depression and PTSD presenting for worsening mood and suicidal ideations with plan to cut herself.  Restart Zoloft 50 mg daily, Vistaril 50 mg 3 times daily as needed for anxiety.  Observation Level/Precautions:  15 minute checks  Laboratory:   Completed in ED  Psychotherapy:    Medications:    Consultations:    Discharge Concerns:    Estimated LOS:  Other:     Physician Treatment Plan for Primary Diagnosis: Major depressive disorder, recurrent severe without psychotic features ([place]) Long Term Goal(s): Improvement in symptoms so as ready for discharge  Short Term Goals: Ability to identify changes in lifestyle to reduce recurrence of condition will improve, Ability to verbalize feelings will improve, Ability to disclose and discuss suicidal ideas, Ability to demonstrate self-control will improve, Ability to identify and develop effective coping behaviors will improve, Ability to maintain clinical  measurements within normal limits will improve, Compliance with prescribed medications will improve, and Ability to identify triggers associated with substance abuse/mental health  issues will improve  Physician Treatment Plan for Secondary Diagnosis: Principal Problem:   Major depressive disorder, recurrent severe without psychotic features ([place]) Active Problems:   Cannabis use disorder, moderate, dependence ([facility])   PTSD (post-traumatic stress disorder)   Cluster B personality disorder ([place])  Long Term Goal(s): Improvement in symptoms so as ready for discharge  Short Term Goals: Ability to identify changes in lifestyle to reduce recurrence of condition will improve, Ability to verbalize feelings will improve, Ability to disclose and discuss suicidal ideas, Ability to demonstrate self-control will improve, Ability to identify and develop effective coping behaviors will improve, Ability to maintain clinical measurements within normal limits will improve, Compliance with prescribed medications will improve, and Ability to identify triggers associated with substance abuse/mental health issues will improve  I certify that inpatient services furnished can reasonably be expected to improve the patient's condition.    Salley Scarlet, MD 7/5/20222:54 PM

## 2021-06-21 NOTE — ED Notes (Signed)
Pt given lunch

## 2021-06-21 NOTE — [facility] Counselor (Signed)
[place]ot [place], patient, provided written consent for CSW team to coordinate aftercare at this time. CSW to have further conversations with patient regarding care in the community.   Patient declined for CSW team to reach collateral contact.   Signed:  Durenda Hurt, MSW, [place], LCASA 06/21/2021 3:45 PM

## 2021-06-21 NOTE — Consult Note (Signed)
Diane Powell   Reason for Powell:  suicidal ideations with a plan Referring Physician:  EDP Patient Identification: Diane Powell MRN:  [id number] Principal Diagnosis: Major depressive disorder, recurrent severe without psychotic features ([place]) Diagnosis:  Principal Problem:   Major depressive disorder, recurrent severe without psychotic features ([place]) Active Problems:   PTSD (post-traumatic stress disorder)   Cluster B personality disorder ([place])   Opiate dependence ([place])   Total Time spent with patient: 1 hour  Subjective:   Diane Powell is a 35 y.o. female patient admitted with depression and suicidal ideations.  HPI:  35 yo female presented to the ED with depression and suicidal ideations with a plan to use a knife to end her life.  Her depression started when she miscarried at 67 weeks of pregnancy last July and increased recently related to the anniversary.  Her significant other is worried about her also.  Depression is high and she feels she had a "mental breakdown" yesterday with thoughts to kill herself.  Past suicide attempt 3 years ago by "trying to strangle myself".  Cluster B traits noted the last time she was admitted here.  Sleep and appetite are "fair", endorses feelings of worthlessness and hopelessness.  She states she is using some "perkies" (Percocets) and alcohol on occasion.  Positive for amphetamines, not alcohol or opiates.  Admit to inpatient psychiatric unit for stabilization.  Past Psychiatric History: depression and anxiety  Risk to Self:  yes Risk to Others:  none Prior Inpatient Therapy:  yes Prior Outpatient Therapy:  none currently  Past Medical History:  Past Medical History:  Diagnosis Date   Asthma    Depression    Migraines    Miscarriage     Past Surgical History:  Procedure Laterality Date   CYST REMOVAL TRUNK     TONSILLECTOMY     Family History:  Family History  Problem Relation Age of Onset    Endometriosis Mother    Diabetes Father    Family Psychiatric  History: none Social History:  Social History   Substance and Sexual Activity  Alcohol Use No   Alcohol/week: 0.0 standard drinks     Social History   Substance and Sexual Activity  Drug Use Yes   Frequency: 6.0 times per week   Types: Marijuana   Comment: MJ use yesterday 07/04/20    Social History   Socioeconomic History   Marital status: Married    Spouse name: Lurena Joiner   Number of children: Not on file   Years of education: Not on file   Highest education level: Not on file  Occupational History   Not on file  Tobacco Use   Smoking status: Every Day    Packs/day: 0.25    Pack years: 0.00    Types: Cigarettes   Smokeless tobacco: Never  Substance and Sexual Activity   Alcohol use: No    Alcohol/week: 0.0 standard drinks   Drug use: Yes    Frequency: 6.0 times per week    Types: Marijuana    Comment: MJ use yesterday 07/04/20   Sexual activity: Yes    Birth control/protection: None  Other Topics Concern   Not on file  Social History Narrative   ** Merged History Encounter **       Social Determinants of Health   Financial Resource Strain: Not on file  Food Insecurity: Not on file  Transportation Needs: Not on file  Physical Activity: Not on file  Stress: Not on file  Social Connections: Not on file   Additional Social History:    Allergies:   Allergies  Allergen Reactions   Other     Pt states she has an allergy to a medication that starts with the letter Z. Pt states it was given IV and that she was very hot and itchy.     Labs:  Results for orders placed or performed during the hospital encounter of 06/20/21 (from the past 48 hour(s))  Comprehensive metabolic panel     Status: Abnormal   Collection Time: 06/20/21  9:20 PM  Result Value Ref Range   Sodium 136 135 - 145 mmol/L   Potassium 3.9 3.5 - 5.1 mmol/L   Chloride 105 98 - 111 mmol/L   CO2 23 22 - 32 mmol/L   Glucose, Bld 120  (H) 70 - 99 mg/dL    Comment: Glucose reference range applies only to samples taken after fasting for at least 8 hours.   BUN 16 6 - 20 mg/dL   Creatinine, Ser 0.78 0.44 - 1.00 mg/dL   Calcium 8.8 (L) 8.9 - 10.3 mg/dL   Total Protein 7.2 6.5 - 8.1 g/dL   Albumin 4.1 3.5 - 5.0 g/dL   AST 16 15 - 41 U/L   ALT 16 0 - 44 U/L   Alkaline Phosphatase 58 38 - 126 U/L   Total Bilirubin 0.7 0.3 - 1.2 mg/dL   GFR, Estimated >60 >60 mL/min    Comment: (NOTE) Calculated using the CKD-EPI Creatinine Equation (2021)    Anion gap 8 5 - 15    Comment: Performed at [facility], [address] [place], [place] [address]  Ethanol     Status: None   Collection Time: 06/20/21  9:20 PM  Result Value Ref Range   Alcohol, Ethyl (B) <10 <10 mg/dL    Comment: (NOTE) Lowest detectable limit for serum alcohol is 10 mg/dL.  For medical purposes only. Performed at [facility], [address] [place], [place] [address]   Salicylate level     Status: Abnormal   Collection Time: 06/20/21  9:20 PM  Result Value Ref Range   Salicylate Lvl <4.1 (L) 7.0 - 30.0 mg/dL    Comment: Performed at [facility], [address] [place], [place] [address]  Acetaminophen level     Status: Abnormal   Collection Time: 06/20/21  9:20 PM  Result Value Ref Range   Acetaminophen (Tylenol), Serum <10 (L) 10 - 30 ug/mL    Comment: (NOTE) Therapeutic concentrations vary significantly. A range of 10-30 ug/mL  may be an effective concentration for many patients. However, some  are best treated at concentrations outside of this range. Acetaminophen concentrations >150 ug/mL at 4 hours after ingestion  and >50 ug/mL at 12 hours after ingestion are often associated with  toxic reactions.  Performed at [facility], [address] [place], [place] [address]   cbc     Status: Abnormal   Collection Time: 06/20/21  9:20 PM  Result Value Ref Range   WBC 15.3 (H) 4.0 - 10.5 K/uL   RBC  4.59 3.87 - 5.11 MIL/uL   Hemoglobin 14.4 12.0 - 15.0 g/dL   HCT 41.5 36.0 - 46.0 %   MCV 90.4 80.0 - 100.0 fL   MCH 31.4 26.0 - 34.0 pg   MCHC 34.7 30.0 - 36.0 g/dL   RDW 13.0 11.5 - 15.5 %   Platelets 229 150 - 400 K/uL   nRBC 0.0  0.0 - 0.2 %    Comment: Performed at [facility], [address] [place], [place] [address]  Urine Drug Screen, Qualitative     Status: Abnormal   Collection Time: 06/20/21  9:20 PM  Result Value Ref Range   Tricyclic, Ur Screen NONE DETECTED NONE DETECTED   Amphetamines, Ur Screen POSITIVE (A) NONE DETECTED   MDMA (Ecstasy)Ur Screen NONE DETECTED NONE DETECTED   Cocaine Metabolite,Ur [place] NONE DETECTED NONE DETECTED   Opiate, Ur Screen NONE DETECTED NONE DETECTED   Phencyclidine (PCP) Ur S NONE DETECTED NONE DETECTED   Cannabinoid 50 Ng, Ur [place] POSITIVE (A) NONE DETECTED   Barbiturates, Ur Screen NONE DETECTED NONE DETECTED   Benzodiazepine, Ur Scrn NONE DETECTED NONE DETECTED   Methadone Scn, Ur NONE DETECTED NONE DETECTED    Comment: (NOTE) Tricyclics + metabolites, urine    Cutoff 1000 ng/mL Amphetamines + metabolites, urine  Cutoff 1000 ng/mL MDMA (Ecstasy), urine              Cutoff 500 ng/mL Cocaine Metabolite, urine          Cutoff 300 ng/mL Opiate + metabolites, urine        Cutoff 300 ng/mL Phencyclidine (PCP), urine         Cutoff 25 ng/mL Cannabinoid, urine                 Cutoff 50 ng/mL Barbiturates + metabolites, urine  Cutoff 200 ng/mL Benzodiazepine, urine              Cutoff 200 ng/mL Methadone, urine                   Cutoff 300 ng/mL  The urine drug screen provides only a preliminary, unconfirmed analytical test result and should not be used for non-medical purposes. Clinical consideration and professional judgment should be applied to any positive drug screen result due to possible interfering substances. A more specific alternate chemical method must be used in order to obtain a confirmed analytical result. Gas  chromatography / mass spectrometry (GC/MS) is the preferred confirm atory method. Performed at [facility], [address] [place], [place] [address]   POC urine preg, ED (not at [facility])     Status: None   Collection Time: 06/20/21  9:29 PM  Result Value Ref Range   Preg Test, Ur NEGATIVE NEGATIVE    Comment:        THE SENSITIVITY OF THIS METHODOLOGY IS >24 mIU/mL   Resp Panel by RT-PCR (Flu A&B, Covid) Nasopharyngeal Swab     Status: None   Collection Time: 06/20/21 10:38 PM   Specimen: Nasopharyngeal Swab; Nasopharyngeal(NP) swabs in vial transport medium  Result Value Ref Range   SARS Coronavirus 2 by RT PCR NEGATIVE NEGATIVE    Comment: (NOTE) SARS-CoV-2 target nucleic acids are NOT DETECTED.  The SARS-CoV-2 RNA is generally detectable in upper respiratory specimens during the acute phase of infection. The lowest concentration of SARS-CoV-2 viral copies this assay can detect is 138 copies/mL. A negative result does not preclude SARS-Cov-2 infection and should not be used as the sole basis for treatment or other patient management decisions. A negative result may occur with  improper specimen collection/handling, submission of specimen other than nasopharyngeal swab, presence of viral mutation(s) within the areas targeted by this assay, and inadequate number of viral copies(<138 copies/mL). A negative result must be combined with clinical observations, patient history, and epidemiological information. The expected result is Negative.  Fact Sheet for Patients:  EntrepreneurPulse.com.au  Fact Sheet for Healthcare Providers:  IncredibleEmployment.be  This test is no t yet approved or cleared by the [place] FDA and  has been authorized for detection and/or diagnosis of SARS-CoV-2 by FDA under an Emergency Use Authorization (EUA). This EUA will remain  in effect (meaning this test can be used) for the duration of  the COVID-19 declaration under Section 564(b)(1) of the Act, 21 U.S.C.section 360bbb-3(b)(1), unless the authorization is terminated  or revoked sooner.       Influenza A by PCR NEGATIVE NEGATIVE   Influenza B by PCR NEGATIVE NEGATIVE    Comment: (NOTE) The Xpert Xpress SARS-CoV-2/FLU/RSV plus assay is intended as an aid in the diagnosis of influenza from Nasopharyngeal swab specimens and should not be used as a sole basis for treatment. Nasal washings and aspirates are unacceptable for Xpert Xpress SARS-CoV-2/FLU/RSV testing.  Fact Sheet for Patients: EntrepreneurPulse.com.au  Fact Sheet for Healthcare Providers: IncredibleEmployment.be  This test is not yet approved or cleared by the [place] FDA and has been authorized for detection and/or diagnosis of SARS-CoV-2 by FDA under an Emergency Use Authorization (EUA). This EUA will remain in effect (meaning this test can be used) for the duration of the COVID-19 declaration under Section 564(b)(1) of the Act, 21 U.S.C. section 360bbb-3(b)(1), unless the authorization is terminated or revoked.  Performed at [facility], [address] [place], [place] [address]     Current Facility-Administered Medications  Medication Dose Route Frequency Provider Last Rate Last Admin   cloNIDine (CATAPRES) tablet 0.1 mg  0.1 mg Oral QID Patrecia Pour, NP       Followed by   Derrill Memo ON 06/23/2021] cloNIDine (CATAPRES) tablet 0.1 mg  0.1 mg Oral BH-qamhs Airlie Blumenberg, Asa Saunas, NP       Followed by   Derrill Memo ON 06/26/2021] cloNIDine (CATAPRES) tablet 0.1 mg  0.1 mg Oral QAC breakfast Patrecia Pour, NP       dicyclomine (BENTYL) tablet 20 mg  20 mg Oral Q6H PRN Patrecia Pour, NP       gabapentin (NEURONTIN) capsule 300 mg  300 mg Oral TID Patrecia Pour, NP       hydrOXYzine (ATARAX/VISTARIL) tablet 25 mg  25 mg Oral Q6H PRN Patrecia Pour, NP       loperamide (IMODIUM) capsule 2-4 mg  2-4 mg  Oral PRN Patrecia Pour, NP       methocarbamol (ROBAXIN) tablet 500 mg  500 mg Oral Q8H PRN Patrecia Pour, NP       naproxen (NAPROSYN) tablet 500 mg  500 mg Oral BID PRN Patrecia Pour, NP       ondansetron (ZOFRAN-ODT) disintegrating tablet 4 mg  4 mg Oral Q6H PRN Patrecia Pour, NP       Current Outpatient Medications  Medication Sig Dispense Refill   Prenatal Vit-Fe Fumarate-FA (PRENATAL MULTIVITAMIN) TABS tablet Take 1 tablet by mouth daily at 12 noon.      Musculoskeletal: Strength & Muscle Tone: within normal limits Gait & Station: normal Patient leans: N/A  Psychiatric Specialty Exam: Physical Exam Vitals and nursing note reviewed.  Constitutional:      Appearance: Normal appearance.  HENT:     Head: Normocephalic.     Nose: Nose normal.  Pulmonary:     Effort: Pulmonary effort is normal.  Musculoskeletal:        General: Normal range of motion.  Cervical back: Normal range of motion.  Neurological:     General: No focal deficit present.     Mental Status: She is alert and oriented to person, place, and time.  Psychiatric:        Attention and Perception: Attention and perception normal.        Mood and Affect: Mood is anxious and depressed.        Speech: Speech normal.        Behavior: Behavior is cooperative.        Thought Content: Thought content includes suicidal ideation. Thought content includes suicidal plan.        Cognition and Memory: Cognition and memory normal.        Judgment: Judgment is impulsive.    Review of Systems  Psychiatric/Behavioral:  Positive for depression, substance abuse and suicidal ideas. The patient is nervous/anxious.   All other systems reviewed and are negative.  Blood pressure 132/90, pulse 87, temperature 98 F (36.7 C), temperature source Oral, resp. rate 18, height 5\' 5"  (1.651 m), weight 79.4 kg, last menstrual period 05/26/2021, SpO2 100 %.Body mass index is 29.12 kg/m.  General Appearance: Disheveled  Eye  Contact:  Fair  Speech:  Clear and Coherent  Volume:  Decreased  Mood:  Anxious and Depressed  Affect:  Congruent  Thought Process:  Coherent and Descriptions of Associations: Intact  Orientation:  Full (Time, Place, and Person)  Thought Content:  Rumination  Suicidal Thoughts:  Yes.  with intent/plan  Homicidal Thoughts:  No  Memory:  Immediate;   Fair Recent;   Fair Remote;   Fair  Judgement:  Poor  Insight:  Fair  Psychomotor Activity:  Decreased  Concentration:  Concentration: Fair and Attention Span: Fair  Recall:  [employer] of Knowledge:  Fair  Language:  Good  Akathisia:  No  Handed:  Right  AIMS (if indicated):     Assets:  Housing Intimacy Leisure Time Physical Health Resilience Social Support  ADL's:  Intact  Cognition:  WNL  Sleep:        Physical Exam: Physical Exam Vitals and nursing note reviewed.  Constitutional:      Appearance: Normal appearance.  HENT:     Head: Normocephalic.     Nose: Nose normal.  Pulmonary:     Effort: Pulmonary effort is normal.  Musculoskeletal:        General: Normal range of motion.     Cervical back: Normal range of motion.  Neurological:     General: No focal deficit present.     Mental Status: She is alert and oriented to person, place, and time.  Psychiatric:        Attention and Perception: Attention and perception normal.        Mood and Affect: Mood is anxious and depressed.        Speech: Speech normal.        Behavior: Behavior is cooperative.        Thought Content: Thought content includes suicidal ideation. Thought content includes suicidal plan.        Cognition and Memory: Cognition and memory normal.        Judgment: Judgment is impulsive.   Review of Systems  Psychiatric/Behavioral:  Positive for depression, substance abuse and suicidal ideas. The patient is nervous/anxious.   All other systems reviewed and are negative. Blood pressure 132/90, pulse 87, temperature 98 F (36.7 C), temperature  source Oral, resp. rate 18, height 5\' 5"  (1.651 m),  weight 79.4 kg, last menstrual period 05/26/2021, SpO2 100 %. Body mass index is 29.12 kg/m.  Treatment Plan Summary: Daily contact with patient to assess and evaluate symptoms and progress in treatment, Medication management, and Plan : Major depressive disorder, recurrent, severe without psychosis: -Admit to [facility] BMU  Opiate and stimulant use disorder: -Started gabapentin 300 mg TID -Started clonidine opiate withdrawal protocol Disposition: Recommend psychiatric Inpatient admission when medically cleared.  Waylan Boga, NP 06/21/2021 12:39 PM

## 2021-06-21 NOTE — Tx Team (Signed)
Initial Treatment Plan 06/21/2021 2:26 PM Diane Powell PCH:[id number]    PATIENT STRESSORS: Substance abuse Traumatic event   PATIENT STRENGTHS: Ability for insight Financial means General fund of knowledge Supportive family/friends Work skills   PATIENT IDENTIFIED PROBLEMS: Depression     Anxiety                  DISCHARGE CRITERIA:  Ability to meet basic life and health needs Improved stabilization in mood, thinking, and/or behavior Verbal commitment to aftercare and medication compliance  PRELIMINARY DISCHARGE PLAN: Outpatient therapy Return to previous living arrangement Return to previous work or school arrangements  PATIENT/FAMILY INVOLVEMENT: This treatment plan has been presented to and reviewed with the patient, Diane Powell, and/or family member,The patient has been given the opportunity to ask questions and make suggestions.  Kieth Brightly, RN 06/21/2021, 2:26 PM

## 2021-06-21 NOTE — Plan of Care (Signed)
Mostly in room. Cooperative but anxious and restless, sad and depressed. Alert and oriented x 4. Denied SI/HI. Complained of nausea and restlessness and received Zofran and Vistaril. Support and encouragements provided. Safety monitored as expected.

## 2021-06-21 NOTE — ED Notes (Signed)
Pt requesting to use phone, given phone and appears to be upset while speaking on phone.

## 2021-06-21 NOTE — Progress Notes (Signed)
36 years old female patient admitted from ED for depression and suicidal ideation.Patient is verbally aggressive for being here. Uses foul language in anger.Patient refused to sign the treatment agreement. Patient denies SI/HI at this time. Patient denies AVH. Patient informed of fall risk status, fall risk assessed "low" at this time. Patient oriented to unit/staff/room. Patient denies any questions/concerns at this time. Patient safe on unit with Q15 minute checks for safety.Skin assessment done with [facility] RN.No contraband found.

## 2021-06-21 NOTE — ED Notes (Signed)
VITALS TO BE ASSESSED WHEN PATIENT IS AWAKE

## 2021-06-21 NOTE — Plan of Care (Signed)
New admission  Problem: Education: Goal: Knowledge of [place] [facility] Education information/materials will improve Outcome: Not Progressing Goal: Emotional status will improve Outcome: Not Progressing Goal: Mental status will improve Outcome: Not Progressing Goal: Verbalization of understanding the information provided will improve Outcome: Not Progressing   Problem: Activity: Goal: Interest or engagement in activities will improve Outcome: Not Progressing Goal: Sleeping patterns will improve Outcome: Not Progressing   Problem: Coping: Goal: Ability to verbalize frustrations and anger appropriately will improve Outcome: Not Progressing Goal: Ability to demonstrate self-control will improve Outcome: Not Progressing   Problem: Health Behavior/Discharge Planning: Goal: Identification of resources available to assist in meeting health care needs will improve Outcome: Not Progressing Goal: Compliance with treatment plan for underlying cause of condition will improve Outcome: Not Progressing   Problem: Physical Regulation: Goal: Ability to maintain clinical measurements within normal limits will improve Outcome: Not Progressing   Problem: Safety: Goal: Periods of time without injury will increase Outcome: Not Progressing   Problem: Education: Goal: Utilization of techniques to improve thought processes will improve Outcome: Not Progressing Goal: Knowledge of the prescribed therapeutic regimen will improve Outcome: Not Progressing   Problem: Activity: Goal: Interest or engagement in leisure activities will improve Outcome: Not Progressing Goal: Imbalance in normal sleep/wake cycle will improve Outcome: Not Progressing   Problem: Coping: Goal: Coping ability will improve Outcome: Not Progressing Goal: Will verbalize feelings Outcome: Not Progressing   Problem: Health Behavior/Discharge Planning: Goal: Ability to make decisions will improve Outcome: Not  Progressing Goal: Compliance with therapeutic regimen will improve Outcome: Not Progressing   Problem: Role Relationship: Goal: Will demonstrate positive changes in social behaviors and relationships Outcome: Not Progressing   Problem: Safety: Goal: Ability to disclose and discuss suicidal ideas will improve Outcome: Not Progressing Goal: Ability to identify and utilize support systems that promote safety will improve Outcome: Not Progressing   Problem: Self-Concept: Goal: Will verbalize positive feelings about self Outcome: Not Progressing Goal: Level of anxiety will decrease Outcome: Not Progressing

## 2021-06-21 NOTE — BHH Suicide Risk Assessment (Signed)
[place] INPATIENT:  Family/Significant Other Suicide Prevention Education  Suicide Prevention Education:  Patient Refusal for Family/Significant Other Suicide Prevention Education: The patient Diane Powell has refused to provide written consent for family/significant other to be provided Family/Significant Other Suicide Prevention Education during admission and/or prior to discharge.  Physician notified.  Durenda Hurt 06/21/2021, 3:44 PM

## 2021-06-21 NOTE — ED Provider Notes (Signed)
Emergency Medicine Observation Re-evaluation Note  Diane Powell is a 35 y.o. female, seen on rounds today.  Pt initially presented to the ED for complaints of No chief complaint on file. Currently, the patient is resting, voices no medical complaints.  Physical Exam  BP 105/67   Pulse 79   Temp 98.4 F (36.9 C) (Oral)   Resp 16   Ht 5\' 5"  (1.651 m)   Wt 79.4 kg   LMP 05/26/2021 (Exact Date)   SpO2 95%   BMI 29.12 kg/m  Physical Exam General: Resting in no acute distress Cardiac: No cyanosis Lungs: Equal rise and fall Psych: Not agitated  ED Course / MDM  EKG:   I have reviewed the labs performed to date as well as medications administered while in observation.  Recent changes in the last 24 hours include no events overnight.  Plan  Current plan is for psychiatric disposition. Patient is under full IVC at this time.   Paulette Blanch, MD 06/21/21 0530

## 2021-06-21 NOTE — BHH Group Notes (Signed)
LCSW Group Therapy Note  06/21/2021 2:10 PM  Type of Therapy/Topic:  Group Therapy:  Feelings about Diagnosis  Participation Level:  Did Not Attend   Description of Group:   This group will allow patients to explore their thoughts and feelings about diagnoses they have received. Patients will be guided to explore their level of understanding and acceptance of these diagnoses. Facilitator will encourage patients to process their thoughts and feelings about the reactions of others to their diagnosis and will guide patients in identifying ways to discuss their diagnosis with significant others in their lives. This group will be process-oriented, with patients participating in exploration of their own experiences, giving and receiving support, and processing challenge from other group members.   Therapeutic Goals: Patient will demonstrate understanding of diagnosis as evidenced by identifying two or more symptoms of the disorder Patient will be able to express two feelings regarding the diagnosis Patient will demonstrate their ability to communicate their needs through discussion and/or role play  Summary of Patient Progress: X  Therapeutic Modalities:   Cognitive Behavioral Therapy Brief Therapy Feelings Identification   [place]wood Pierron R. Guerry Bruin, MSW, [place], [place] 06/21/2021 2:10 PM

## 2021-06-21 NOTE — BH Assessment (Signed)
Comprehensive Clinical Assessment (CCA) Note  06/21/2021 Diane Powell [id number]  Scot Dock, 35 year old female who presents to [facility] ED involuntarily for treatment. Per triage note, Pt says she "does not feel safe and I can't keep myself safe" When asked what this means, she says "like I am going to end it". Reports she would use a knife to harm herself. Pt reports behavioral history, suppose to be on medications, but "cannot afford them".    During TTS assessment pt presents alert and oriented x 4, anxious but cooperative, and mood-congruent with affect. The pt does not appear to be responding to internal or external stimuli. Neither is the pt presenting with any delusional thinking. Pt verified the information provided to triage RN.   Pt identifies her main complaint to be that she is still grieving the loss of her son. Patient reports having a miscarriage at 20 weeks about a year ago and she is struggling to get back to "normal". Patient reports she is not taking any prescribed medications but admitted to taking Percocets. Patient also stated she smokes marijuana. Pt reports no current INPT hx. Pt denies HI/AH/VH. Patient mood is sad, depressed. Pt is unable to contract for safety.   Per York Cerise, NP, pt is recommended for inpatient psychiatric admission.    Chief Complaint:  Chief Complaint  Patient presents with   Depression   Suicidal    Patient reports she does not feel safe.    Visit Diagnosis: Major depressive disorder    CCA Screening, Triage and Referral (STR)  Patient Reported Information How did you hear about [place]? No data recorded Referral name: No data recorded Referral phone number: No data recorded  Whom do you see for routine medical problems? No data recorded Practice/Facility Name: No data recorded Practice/Facility Phone Number: No data recorded Name of Contact: No data recorded Contact Number: No data recorded Contact Fax Number: No data  recorded Prescriber Name: No data recorded Prescriber Address (if known): No data recorded  What Is the Reason for Your Visit/Call Today? Patient reports having SI and severe depression. Patient states she does not feel safe.  How Long Has This Been Causing You Problems? > than 6 months  What Do You Feel Would Help You the Most Today? Medication(s); Treatment for Depression or other mood problem   Have You Recently Been in Any Inpatient Treatment (Hospital/Detox/Crisis Center/28-Day Program)? No data recorded Name/Location of Program/Hospital:No data recorded How Long Were You There? No data recorded When Were You Discharged? No data recorded  Have You Ever Received Services From [facility] Before? No data recorded Who Do You See at [facility]? No data recorded  Have You Recently Had Any Thoughts About Hurting Yourself? Yes  Are You Planning to Commit Suicide/Harm Yourself At This time? No   Have you Recently Had Thoughts About [place]? No  Explanation: No data recorded  Have You Used Any Alcohol or Drugs in the Past 24 Hours? Yes  How Long Ago Did You Use Drugs or Alcohol? No data recorded What Did You Use and How Much? Marijuana and percocets   Do You Currently Have a Therapist/Psychiatrist? No  Name of Therapist/Psychiatrist: No data recorded  Have You Been Recently Discharged From Any Office Practice or Programs? No  Explanation of Discharge From Practice/Program: No data recorded    CCA Screening Triage Referral Assessment Type of Contact: Face-to-Face  Is this Initial or Reassessment? No data recorded Date Telepsych consult ordered in [facility]:  No  data recorded Time Telepsych consult ordered in [facility]:  No data recorded  Patient Reported Information Reviewed? No data recorded Patient Left Without Being Seen? No data recorded Reason for Not Completing Assessment: No data recorded  Collateral Involvement: None provided   Does Patient Have a [place]? No data recorded Name and Contact of Legal Guardian: No data recorded If Minor and Not Living with Parent(s), Who has Custody? n/a  Is CPS involved or ever been involved? Never  Is APS involved or ever been involved? Never   Patient Determined To Be At Risk for Harm To Self or Others Based on Review of Patient Reported Information or Presenting Complaint? No  Method: No data recorded Availability of Means: No data recorded Intent: No data recorded Notification Required: No data recorded Additional Information for Danger to Others Potential: No data recorded Additional Comments for Danger to Others Potential: No data recorded Are There Guns or Other Weapons in Your Home? No data recorded Types of Guns/Weapons: No data recorded Are These Weapons Safely Secured?                            No data recorded Who Could Verify You Are Able To Have These Secured: No data recorded Do You Have any Outstanding Charges, Pending Court Dates, Parole/Probation? No data recorded Contacted To Inform of Risk of Harm To Self or Others: No data recorded  Location of Assessment: [facility] ED   Does Patient Present under Involuntary Commitment? Yes  IVC Papers Initial File Date: 06/21/21   [place] of Residence: [place]   Patient Currently Receiving the Following Services: Not Receiving Services   Determination of Need: Urgent (48 hours)   Options For Referral: Inpatient Hospitalization; Medication Management     CCA Biopsychosocial Intake/Chief Complaint:  No data recorded Current Symptoms/Problems: No data recorded  Patient Reported Schizophrenia/Schizoaffective Diagnosis in Past: No   Strengths: No data recorded Preferences: No data recorded Abilities: No data recorded  Type of Services Patient Feels are Needed: No data recorded  Initial Clinical Notes/Concerns: No data recorded  Mental Health Symptoms Depression:   Change in energy/activity;  Increase/decrease in appetite; Difficulty Concentrating   Duration of Depressive symptoms:  Greater than two weeks   Mania:   None   Anxiety:    Difficulty concentrating   Psychosis:   None   Duration of Psychotic symptoms: No data recorded  Trauma:   Emotional numbing; Detachment from others   Obsessions:   None   Compulsions:   Disrupts with routine/functioning   Inattention:   Disorganized   Hyperactivity/Impulsivity:   None   Oppositional/Defiant Behaviors:   None   Emotional Irregularity:   Chronic feelings of emptiness   Other Mood/Personality Symptoms:  No data recorded   Mental Status Exam Appearance and self-care  Stature:   Average   Weight:   Average weight   Clothing:   Casual   Grooming:   Normal   Cosmetic use:   None   Posture/gait:   Slumped   Motor activity:   Not Remarkable   Sensorium  Attention:   Normal   Concentration:   Anxiety interferes   Orientation:   X5   Recall/memory:   Normal   Affect and Mood  Affect:   Depressed; Flat   Mood:   Depressed; Hopeless   Relating  Eye contact:   None   Facial expression:   Depressed; Sad   Attitude toward  examiner:   Cooperative   Thought and Language  Speech flow:  Clear and Coherent   Thought content:   Appropriate to Mood and Circumstances   Preoccupation:   None   Hallucinations:   None   Organization:  No data recorded  [employer] of Knowledge:   Average   Intelligence:   Average   Abstraction:   Normal   Judgement:   Impaired   Reality Testing:   Distorted   Insight:   Poor   Decision Making:   Impulsive   Social Functioning  Social Maturity:   Impulsive   Social Judgement:   Victimized   Stress  Stressors:   Grief/losses   Coping Ability:   Programme researcher, broadcasting/film/video Deficits:   Communication; Decision making   Supports:   Family     Religion:    Leisure/Recreation:     Exercise/Diet: Exercise/Diet Have You Gained or Lost A Significant Amount of Weight in the Past Six Months?: No Do You Follow a Special Diet?: No Do You Have Any Trouble Sleeping?: No   CCA Employment/Education Employment/Work Situation:    Education:     CCA Family/Childhood History Family and Relationship History:    Childhood History:     Child/Adolescent Assessment:     CCA Substance Use Alcohol/Drug Use: Alcohol / Drug Use Pain Medications: See PTA Prescriptions: See PTA Over the Counter: See PTA History of alcohol / drug use?: Yes Negative Consequences of Use: Personal relationships Substance #1 Name of Substance 1: Percocets Substance #2 Name of Substance 2: Marijuana                     ASAM's:  Six Dimensions of Multidimensional Assessment  Dimension 1:  Acute Intoxication and/or Withdrawal Potential:      Dimension 2:  Biomedical Conditions and Complications:      Dimension 3:  Emotional, Behavioral, or Cognitive Conditions and Complications:     Dimension 4:  Readiness to Change:     Dimension 5:  Relapse, Continued use, or Continued Problem Potential:     Dimension 6:  Recovery/Living Environment:     ASAM Severity Score:    ASAM Recommended Level of Treatment:     Substance use Disorder (SUD)    Recommendations for Services/Supports/Treatments:    DSM5 Diagnoses: Patient Active Problem List   Diagnosis Date Noted   Missed abortion with fetal demise before 20 completed weeks of gestation 07/05/2020   PTSD (post-traumatic stress disorder) 10/16/2018   Cluster B personality disorder ([place]) 10/16/2018   Cannabis use disorder, moderate, dependence ([place]) 10/15/2018   Amphetamine use disorder, severe ([place]) 10/15/2018   Major depressive disorder, recurrent severe without psychotic features ([place]) 10/15/2018   Suicidal ideation 10/15/2018    Patient Centered Plan: Patient is on the following Treatment Plan(s):   Depression   Referrals to Alternative Service(s): Referred to Alternative Service(s):   Place:   Date:   Time:    Referred to Alternative Service(s):   Place:   Date:   Time:    Referred to Alternative Service(s):   Place:   Date:   Time:    Referred to Alternative Service(s):   Place:   Date:   Time:     Dorian Duval Glennon Mac, Counselor, LCAS-A

## 2021-06-22 MED ORDER — HYDROXYZINE HCL 50 MG PO TABS: 50.0000 mg | ORAL_TABLET | Freq: Three times a day (TID) | ORAL | 1 refills | Status: AC | PRN

## 2021-06-22 MED ORDER — SERTRALINE HCL 50 MG PO TABS: 50.0000 mg | ORAL_TABLET | Freq: Every day | ORAL | 1 refills | Status: AC

## 2021-06-22 NOTE — BHH Suicide Risk Assessment (Signed)
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[place])   Total Time spent with patient: 35 minutes- 25 minutes face-to-face contact with patient, 10 minutes documentation, coordination of care, scripts   Musculoskeletal: Strength & Muscle Tone: within normal limits Gait & Station: normal Patient leans: N/A  Psychiatric Specialty Exam  Presentation  General Appearance: Fairly Groomed  Eye Contact:Good  Speech:Normal Rate  Speech Volume:Normal  Handedness:Right   Mood and Affect  Mood:Euthymic  Duration of Depression Symptoms: Greater than two weeks  Affect:Congruent   Thought Process  Thought Processes:Goal Directed; Coherent  Descriptions of Associations:Intact  Orientation:Full (Time, Place and Person)  Thought Content:Logical  History of Schizophrenia/Schizoaffective disorder:No  Duration of Psychotic Symptoms:No data recorded Hallucinations:Hallucinations: None  Ideas of Reference:None  Suicidal Thoughts:Suicidal Thoughts: No  Homicidal Thoughts:Homicidal Thoughts: No   Sensorium  Memory:Immediate Fair; Recent Fair; Remote Fair  Judgment:Intact  Insight:Present   Executive Functions  Concentration:Good  Attention Span:Good  [place] of Knowledge:Good  Language:Good   Psychomotor Activity  Psychomotor Activity:Psychomotor Activity: Normal   Assets  Assets:Communication Skills; Desire for Improvement; Financial Resources/Insurance; Housing; Intimacy; Leisure Time; Physical Health; Resilience; Social Support; Talents/Skills   Sleep  Sleep:Sleep: Good Number of Hours of  Sleep: 7.5   Physical Exam: Physical Exam ROS Blood pressure 116/83, pulse 83, temperature 98.5 F (36.9 C), temperature source Oral, resp. rate 16, height 5\' 5"  (1.651 m), weight 72.1 kg, last menstrual period 05/26/2021, SpO2 99 %. Body mass index is 26.46 kg/m.  Mental Status Per Nursing Assessment::   On Admission:  NA  Demographic Factors:  Unemployed  Loss Factors: NA  Historical Factors: Prior suicide attempts  Risk Reduction Factors:   Sense of responsibility to family, Living with another person, especially a relative, Positive social support, Positive therapeutic relationship, and Positive coping skills or problem solving skills  Continued Clinical Symptoms:  Depression:   Recent sense of peace/wellbeing Previous Psychiatric Diagnoses and Treatments  Cognitive Features That Contribute To Risk:  Polarized thinking    Suicide Risk:  Mild:  Suicidal ideation of limited frequency, intensity, duration, and specificity.  There are no identifiable plans, no associated intent, mild dysphoria and related symptoms, good self-control (both objective and subjective assessment), few other risk factors, and identifiable protective factors, including available and accessible social support.    Plan Of Care/Follow-up recommendations:  Activity:  as tolerated Diet:  regular diet  Powell Scarlet, MD 06/22/2021, 9:22 AM

## 2021-06-22 NOTE — Progress Notes (Signed)
Pt denies SI, HI, and AVH. She was educated on prescriptions and follow up care. Pt questions were answered and pt verbalized understanding and did not voice any concerns. Pt did not have any belongings in a locker. Pt was safely discharged to the [facility].

## 2021-06-22 NOTE — Progress Notes (Signed)
Recreation Therapy Notes  Date: 06/22/2021  Time: 10:00 am   Location: [facility]   Behavioral response: N/A   Intervention Topic: Creative expressions   Discussion/Intervention: Patient did not attend group.   Clinical Observations/Feedback:  Patient did not attend group.   Jaana Brodt LRT/CTRS         Julious Langlois 06/22/2021 12:34 PM

## 2021-06-22 NOTE — Discharge Summary (Signed)
Physician Discharge Summary Note  Patient:  Diane Powell is an 35 y.o., female MRN:  [id number] DOB:  06/03/1986 Patient phone:  [phone] (home)  Patient address:   [place] [address],  Total Time spent with patient: 35 minutes- 25 minutes face-to-face contact with patient, 10 minutes documentation, coordination of care, scripts  Date of Admission:  06/21/2021 Date of Discharge: 06/22/2021  Reason for Admission:  35 year old female who presents for worsening depression and suicidal ideation  Principal Problem: Major depressive disorder, recurrent severe without psychotic features ([place]) Discharge Diagnoses: Principal Problem:   Major depressive disorder, recurrent severe without psychotic features ([place]) Active Problems:   Cannabis use disorder, moderate, dependence ([place])   PTSD (post-traumatic stress disorder)   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  Was previously seeing an outpatient provider, but they have stopped excepting her Medicaid.  Previously did well on Zoloft and hydroxyzine.  Past Medical History:  Past Medical History:  Diagnosis Date   Asthma    Depression    Migraines    Miscarriage     Past Surgical History:  Procedure Laterality Date   CYST REMOVAL TRUNK     TONSILLECTOMY     Family History:  Family History  Problem Relation Age of Onset   Endometriosis Mother    Diabetes Father    Family Psychiatric  History: Denies Social History:  Social History   Substance and Sexual Activity  Alcohol Use No   Alcohol/week: 0.0 standard drinks     Social History   Substance and Sexual Activity  Drug Use Yes   Frequency: 6.0 times per week   Types: Marijuana   Comment: MJ use yesterday 07/04/20    Social History   Socioeconomic History   Marital status: Married    Spouse name: Lurena Joiner   Number of children: Not on file   Years  of education: Not on file   Highest education level: Not on file  Occupational History   Not on file  Tobacco Use   Smoking status: Every Day    Packs/day: 0.25    Pack years: 0.00    Types: Cigarettes   Smokeless tobacco: Never  Substance and Sexual Activity   Alcohol use: No    Alcohol/week: 0.0 standard drinks   Drug use: Yes    Frequency: 6.0 times per week    Types: Marijuana    Comment: MJ use yesterday 07/04/20   Sexual activity: Yes    Birth control/protection: None  Other Topics Concern   Not on file  Social History Narrative   ** Merged History Encounter **       Social Determinants of Health   Financial Resource Strain: Not on file  Food Insecurity: Not on file  Transportation Needs: Not on file  Physical Activity: Not on file  Stress: Not on file  Social Connections: Not on file    Hospital Course:   35 year old female who presents for worsening depression and suicidal ideation. Patient was restarted on Zoloft and Hydroxyzine while here. She denies suicidal ideations, homicidal ideations, visual hallucinations, and auditory hallucinations. She desired outpatient follow-up with psychiatrist and therapist, and this was arranged with [facility] prior to discharge.   Physical Findings: AIMS:  , ,  ,  ,    CIWA:  CIWA-Ar Total: 5 COWS:  COWS Total Score: 3  Musculoskeletal: Strength & Muscle Tone: within normal limits Gait &  Station: normal Patient leans: N/A   Psychiatric Specialty Exam:  Presentation  General Appearance: Fairly Groomed  Eye Contact:Good  Speech:Normal Rate  Speech Volume:Normal  Handedness:Right   Mood and Affect  Mood:Euthymic  Affect:Congruent   Thought Process  Thought Processes:Goal Directed; Coherent  Descriptions of Associations:Intact  Orientation:Full (Time, Place and Person)  Thought Content:Logical  History of Schizophrenia/Schizoaffective disorder:No  Duration of Psychotic Symptoms:No data  recorded Hallucinations:Hallucinations: None  Ideas of Reference:None  Suicidal Thoughts:Suicidal Thoughts: No  Homicidal Thoughts:Homicidal Thoughts: No   Sensorium  Memory:Immediate Fair; Recent Fair; Remote Fair  Judgment:Intact  Insight:Present   Executive Functions  Concentration:Good  Attention Span:Good  [place] of Knowledge:Good  Language:Good   Psychomotor Activity  Psychomotor Activity:Psychomotor Activity: Normal   Assets  Assets:Communication Skills; Desire for Improvement; Financial Resources/Insurance; Housing; Intimacy; Leisure Time; Physical Health; Resilience; Social Support; Talents/Skills   Sleep  Sleep:Sleep: Good Number of Hours of Sleep: 7.5    Physical Exam: Physical Exam Vitals and nursing note reviewed.  Constitutional:      Appearance: Normal appearance.  HENT:     Head: Normocephalic and atraumatic.     Right Ear: External ear normal.     Left Ear: External ear normal.     Nose: Nose normal.     Mouth/Throat:     Mouth: Mucous membranes are moist.     Pharynx: Oropharynx is clear.  Eyes:     Extraocular Movements: Extraocular movements intact.     Conjunctiva/sclera: Conjunctivae normal.     Pupils: Pupils are equal, round, and reactive to light.  Cardiovascular:     Rate and Rhythm: Normal rate.     Pulses: Normal pulses.  Pulmonary:     Effort: Pulmonary effort is normal.     Breath sounds: Normal breath sounds.  Abdominal:     General: Abdomen is flat.     Palpations: Abdomen is soft.  Musculoskeletal:        General: No swelling. Normal range of motion.     Cervical back: Normal range of motion and neck supple.  Skin:    General: Skin is warm and dry.  Neurological:     General: No focal deficit present.     Mental Status: She is alert and oriented to person, place, and time.  Psychiatric:        Mood and Affect: Mood normal.        Behavior: Behavior normal.        Thought Content: Thought  content normal.        Judgment: Judgment normal.   Review of Systems  Constitutional: Negative.   HENT: Negative.    Eyes: Negative.   Respiratory: Negative.    Cardiovascular: Negative.   Gastrointestinal: Negative.   Genitourinary: Negative.   Musculoskeletal: Negative.   Skin: Negative.   Neurological: Negative.   Endo/Heme/Allergies:  Positive for environmental allergies. Does not bruise/bleed easily.  Psychiatric/Behavioral:  Negative for hallucinations, memory loss and suicidal ideas. The patient does not have insomnia.   Blood pressure 116/83, pulse 83, temperature 98.5 F (36.9 C), temperature source Oral, resp. rate 16, height 5\' 5"  (1.651 m), weight 72.1 kg, last menstrual period 05/26/2021, SpO2 99 %. Body mass index is 26.46 kg/m.   Social History   Tobacco Use  Smoking Status Every Day   Packs/day: 0.25   Pack years: 0.00   Types: Cigarettes  Smokeless Tobacco Never   Tobacco Cessation:  A prescription for an FDA-approved tobacco cessation medication was offered  at discharge and the patient refused   Blood Alcohol level:  Lab Results  Component Value Date   [facility] <10 06/20/2021   ETH <10 89/[birth year]1941    Metabolic Disorder Labs:  Lab Results  Component Value Date   HGBA1C 5.1 10/15/2018   MPG 99.67 10/15/2018   No results found for: PROLACTIN Lab Results  Component Value Date   CHOL 152 10/15/2018   TRIG 99 10/15/2018   HDL 32 (L) 10/15/2018   CHOLHDL 4.8 10/15/2018   VLDL 20 10/15/2018   LDLCALC 100 (H) 10/15/2018    See Psychiatric Specialty Exam and Suicide Risk Assessment completed by Attending Physician prior to discharge.  Discharge destination:  Home  Is patient on multiple antipsychotic therapies at discharge:  No   Has Patient had three or more failed trials of antipsychotic monotherapy by history:  No  Recommended Plan for Multiple Antipsychotic Therapies: NA  Discharge Instructions     Diet general   Complete by: As directed     Increase activity slowly   Complete by: As directed       Allergies as of 06/22/2021       Reactions   Other    Pt states she has an allergy to a medication that starts with the letter Z. Pt states it was given IV and that she was very hot and itchy.         Medication List     TAKE these medications      Indication  hydrOXYzine 50 MG tablet Commonly known as: ATARAX/VISTARIL Take 1 tablet (50 mg total) by mouth 3 (three) times daily as needed for anxiety.  Indication: Feeling Anxious   prenatal multivitamin Tabs tablet Take 1 tablet by mouth daily at 12 noon.  Indication: Vitamin Deficiency   sertraline 50 MG tablet Commonly known as: ZOLOFT Take 1 tablet (50 mg total) by mouth daily. Start taking on: June 23, 2021  Indication: Major Depressive Disorder         Follow-up recommendations:  Activity:  as tolerated Diet:  regular diet  Comments:  Printed 30-day scripts with 1 refill given at discharge  Signed: Salley Scarlet, MD 06/22/2021, 9:24 AM

## 2021-06-22 NOTE — Progress Notes (Signed)
  [facility] Adult Case Management Discharge Plan :  Will you be returning to the same living situation after discharge:  Yes,  pt reports that she is returning home. At discharge, do you have transportation home?: Yes,  family will provide transportation. Do you have the ability to pay for your medications: Yes,  [employer] Medicaid  Release of information consent forms completed and in the chart;  Patient's signature needed at discharge.  Patient to Follow up at:  Follow-up Information     [facility], [facility] Follow up.   Why: Walk in hours are from 8AM to 5PM.  Please bring your ID and copy of insurance card. Thanks! Contact information: [place] [address] [phone]                 Next level of care provider has access to [place] and Suicide Prevention discussed: Yes,  SPE completed with the patient's husband.      Has patient been referred to the Quitline?: Patient refused referral  Patient has been referred for addiction treatment: Yes  Rozann Lesches, LCSW 06/22/2021, 9:31 AM

## 2021-06-22 NOTE — Tx Team (Signed)
Interdisciplinary Treatment and Diagnostic Plan Update  06/22/2021 Time of Session: 9:00AM Diane Powell MRN: [id number]  Principal Diagnosis: Major depressive disorder, recurrent severe without psychotic features ([place])  Secondary Diagnoses: Principal Problem:   Major depressive disorder, recurrent severe without psychotic features ([place]) Active Problems:   Cannabis use disorder, moderate, dependence ([place])   PTSD (post-traumatic stress disorder)   Cluster B personality disorder ([place])   Current Medications:  Current Facility-Administered Medications  Medication Dose Route Frequency Provider Last Rate Last Admin   acetaminophen (TYLENOL) tablet 650 mg  650 mg Oral Q6H PRN Patrecia Pour, NP       alum & mag hydroxide-simeth (MAALOX/MYLANTA) 200-200-20 MG/5ML suspension 30 mL  30 mL Oral Q4H PRN Patrecia Pour, NP       cloNIDine (CATAPRES) tablet 0.1 mg  0.1 mg Oral QID Patrecia Pour, NP   0.1 mg at 06/22/21 8546   Followed by   Derrill Memo ON 06/23/2021] cloNIDine (CATAPRES) tablet 0.1 mg  0.1 mg Oral BH-qamhs Patrecia Pour, NP       Followed by   Derrill Memo ON 06/26/2021] cloNIDine (CATAPRES) tablet 0.1 mg  0.1 mg Oral QAC breakfast Patrecia Pour, NP       dicyclomine (BENTYL) tablet 20 mg  20 mg Oral Q6H PRN Patrecia Pour, NP       gabapentin (NEURONTIN) capsule 300 mg  300 mg Oral TID Patrecia Pour, NP   300 mg at 06/22/21 0758   hydrOXYzine (ATARAX/VISTARIL) tablet 50 mg  50 mg Oral TID PRN Salley Scarlet, MD   50 mg at 06/21/21 2118   loperamide (IMODIUM) capsule 2-4 mg  2-4 mg Oral PRN Patrecia Pour, NP       magnesium hydroxide (MILK OF MAGNESIA) suspension 30 mL  30 mL Oral Daily PRN Patrecia Pour, NP       methocarbamol (ROBAXIN) tablet 500 mg  500 mg Oral Q8H PRN Patrecia Pour, NP       naproxen (NAPROSYN) tablet 500 mg  500 mg Oral BID PRN Patrecia Pour, NP       ondansetron (ZOFRAN-ODT) disintegrating tablet 4 mg  4 mg Oral Q6H PRN Patrecia Pour, NP   4 mg at  06/22/21 0800   sertraline (ZOLOFT) tablet 50 mg  50 mg Oral Daily Salley Scarlet, MD   50 mg at 06/22/21 2703   PTA Medications: Medications Prior to Admission  Medication Sig Dispense Refill Last Dose   Prenatal Vit-Fe Fumarate-FA (PRENATAL MULTIVITAMIN) TABS tablet Take 1 tablet by mouth daily at 12 noon.       Patient Stressors: Substance abuse Traumatic event  Patient Strengths: Ability for Education officer, environmental fund of knowledge Supportive family/friends Work skills  Treatment Modalities: Medication Management, Group therapy, Case management,  1 to 1 session with clinician, Psychoeducation, Recreational therapy.   Physician Treatment Plan for Primary Diagnosis: Major depressive disorder, recurrent severe without psychotic features ([place]) Long Term Goal(s): Improvement in symptoms so as ready for discharge   Short Term Goals: Ability to identify changes in lifestyle to reduce recurrence of condition will improve Ability to verbalize feelings will improve Ability to disclose and discuss suicidal ideas Ability to demonstrate self-control will improve Ability to identify and develop effective coping behaviors will improve Ability to maintain clinical measurements within normal limits will improve Compliance with prescribed medications will improve Ability to identify triggers associated with substance abuse/mental health issues will improve  Medication Management: Evaluate  patient's response, side effects, and tolerance of medication regimen.  Therapeutic Interventions: 1 to 1 sessions, Unit Group sessions and Medication administration.  Evaluation of Outcomes: Adequate for Discharge  Physician Treatment Plan for Secondary Diagnosis: Principal Problem:   Major depressive disorder, recurrent severe without psychotic features ([place]) Active Problems:   Cannabis use disorder, moderate, dependence ([facility])   PTSD (post-traumatic stress disorder)   Cluster B  personality disorder ([place])  Long Term Goal(s): Improvement in symptoms so as ready for discharge   Short Term Goals: Ability to identify changes in lifestyle to reduce recurrence of condition will improve Ability to verbalize feelings will improve Ability to disclose and discuss suicidal ideas Ability to demonstrate self-control will improve Ability to identify and develop effective coping behaviors will improve Ability to maintain clinical measurements within normal limits will improve Compliance with prescribed medications will improve Ability to identify triggers associated with substance abuse/mental health issues will improve     Medication Management: Evaluate patient's response, side effects, and tolerance of medication regimen.  Therapeutic Interventions: 1 to 1 sessions, Unit Group sessions and Medication administration.  Evaluation of Outcomes: Adequate for Discharge   RN Treatment Plan for Primary Diagnosis: Major depressive disorder, recurrent severe without psychotic features ([place]) Long Term Goal(s): Knowledge of disease and therapeutic regimen to maintain health will improve  Short Term Goals: Ability to remain free from injury will improve, Ability to verbalize frustration and anger appropriately will improve, Ability to demonstrate self-control, Ability to participate in decision making will improve, Ability to verbalize feelings will improve, Ability to disclose and discuss suicidal ideas, Ability to identify and develop effective coping behaviors will improve, and Compliance with prescribed medications will improve  Medication Management: RN will administer medications as ordered by provider, will assess and evaluate patient's response and provide education to patient for prescribed medication. RN will report any adverse and/or side effects to prescribing provider.  Therapeutic Interventions: 1 on 1 counseling sessions, Psychoeducation, Medication administration, Evaluate  responses to treatment, Monitor vital signs and CBGs as ordered, Perform/monitor CIWA, COWS, AIMS and Fall Risk screenings as ordered, Perform wound care treatments as ordered.  Evaluation of Outcomes: Adequate for Discharge   LCSW Treatment Plan for Primary Diagnosis: Major depressive disorder, recurrent severe without psychotic features ([place]) Long Term Goal(s): Safe transition to appropriate next level of care at discharge, Engage patient in therapeutic group addressing interpersonal concerns.  Short Term Goals: Engage patient in aftercare planning with referrals and resources, Increase social support, Increase ability to appropriately verbalize feelings, Increase emotional regulation, Facilitate acceptance of mental health diagnosis and concerns, Identify triggers associated with mental health/substance abuse issues, and Increase skills for wellness and recovery  Therapeutic Interventions: Assess for all discharge needs, 1 to 1 time with Social worker, Explore available resources and support systems, Assess for adequacy in community support network, Educate family and significant other(s) on suicide prevention, Complete Psychosocial Assessment, Interpersonal group therapy.  Evaluation of Outcomes: Adequate for Discharge   Progress in Treatment: Attending groups: No. Participating in groups: No. Taking medication as prescribed: Yes. Toleration medication: Yes. Family/Significant other contact made: No, will contact:  when given permission. Patient understands diagnosis: Yes. Discussing patient identified problems/goals with staff: Yes. Medical problems stabilized or resolved: Yes. Denies suicidal/homicidal ideation: Yes. Issues/concerns per patient self-inventory: No. Other: none.  New problem(s) identified: No, Describe:  none.  New Short Term/Long Term Goal(s): medication management for mood stabilization; elimination of SI thoughts; development of comprehensive mental wellness  plan.  Patient Goals: "Finding  a therapist and going home."   Discharge Plan or Barriers: Pt to discharge home with outpatient follow up.  Reason for Continuation of Hospitalization: Depression Suicidal ideation  Estimated Length of Stay: 1-7 days  Attendees: Patient: Diane Powell 06/22/2021 9:23 AM  Physician: Selina Cooley, MD 06/22/2021 9:23 AM  Nursing: Marla Roe, RN 06/22/2021 9:23 AM  RN Care Manager: 06/22/2021 9:23 AM  Social Worker: Chalmers Guest. Guerry Bruin, MSW, [place], [place] 06/22/2021 9:23 AM  Recreational Therapist: Devin Going, LRT 06/22/2021 9:23 AM  Other: Assunta Curtis, MSW, LCSW 06/22/2021 9:23 AM  Other: Paulla Dolly, MSW, [place] 06/22/2021 9:23 AM  Other: 06/22/2021 9:23 AM    Scribe for Treatment Team: Shirl Harris, LCSW 06/22/2021 9:23 AM

## 2021-11-09 IMAGING — US US OB < 14 WEEKS - US OB TV
1 series · 14 of 28 positions shown · non-contrast
Comparison: None.

CLINICAL DATA: Right and left pelvic pain

EXAM:
OBSTETRIC <14 WK US AND TRANSVAGINAL OB US
TECHNIQUE: Both transabdominal and transvaginal ultrasound examinations were
performed for complete evaluation of the gestation as well as the
maternal uterus, adnexal regions, and pelvic cul-de-sac.
Transvaginal technique was performed to assess early pregnancy.

[Series 1: us ob < 14 weeks - us ob tv · 0.22mm/px · 131 acquisitions, 14 frames shown]
[im 5/131]
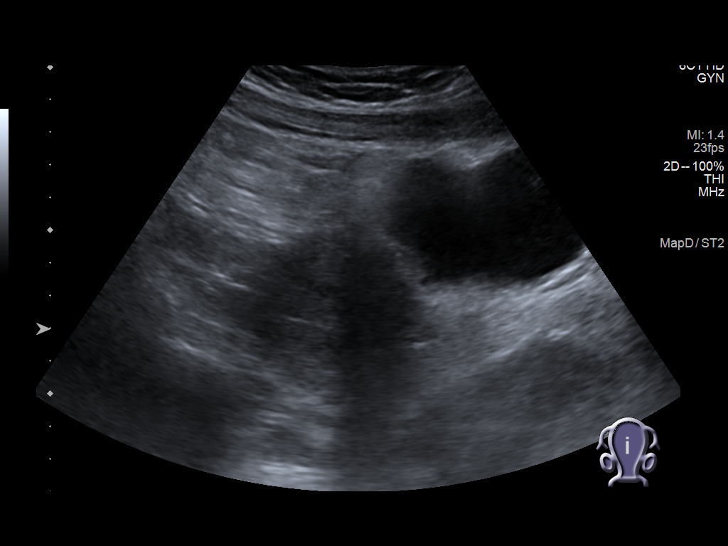
[im 15/131]
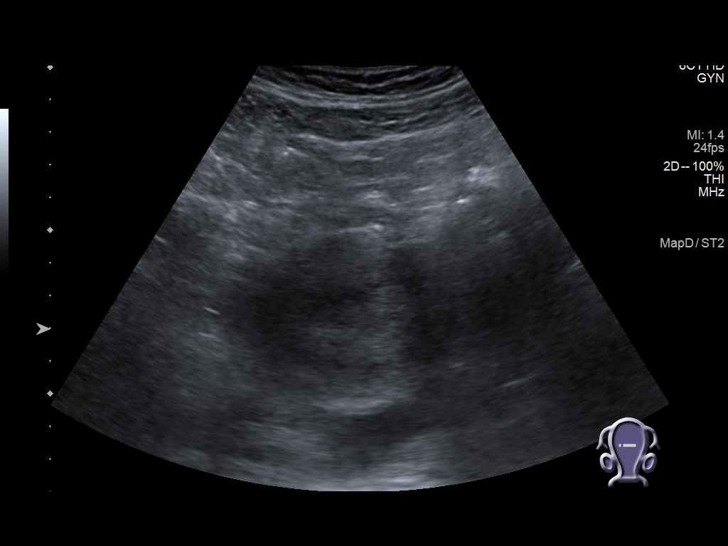
[im 25/131]
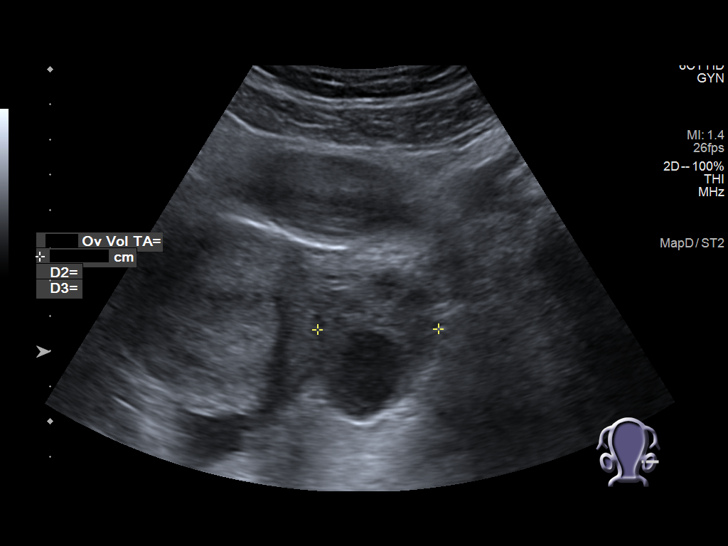
[im 34/131]
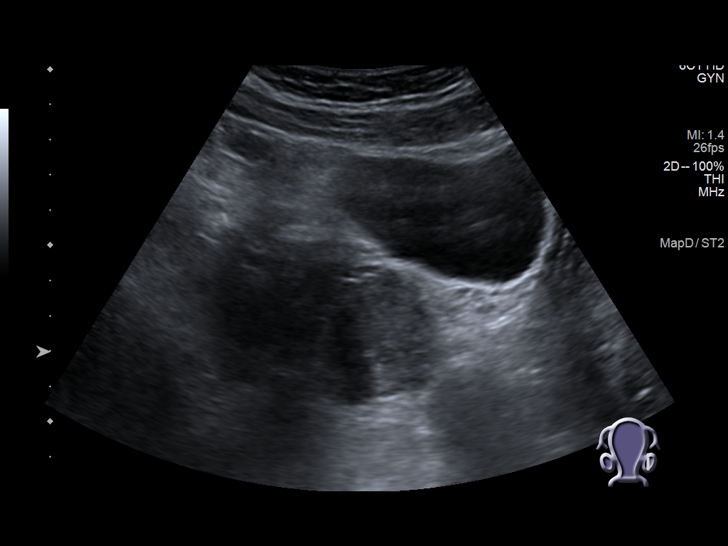
[im 44/131]
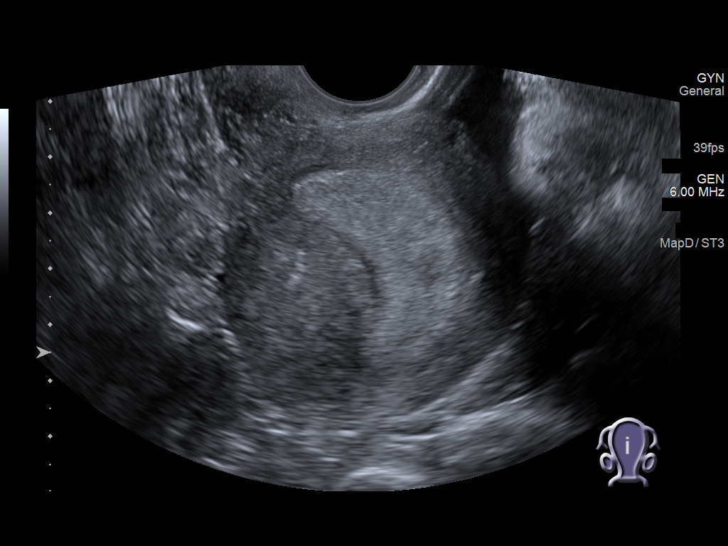
[im 53/131]
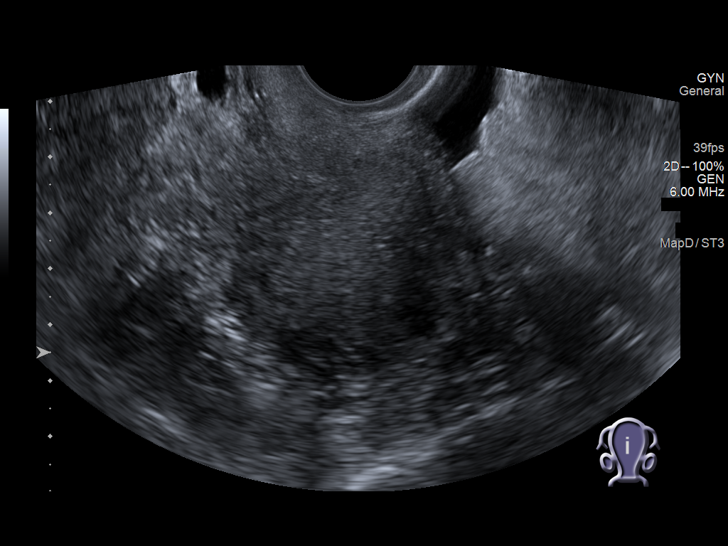
[im 63/131]
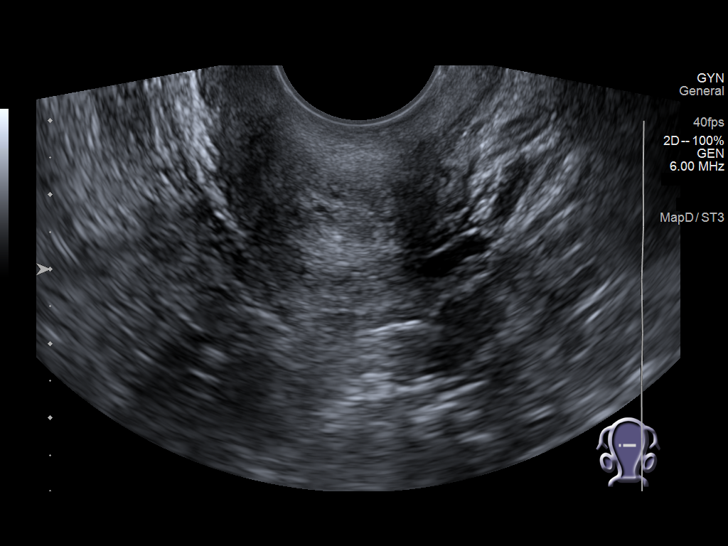
[im 73/131]
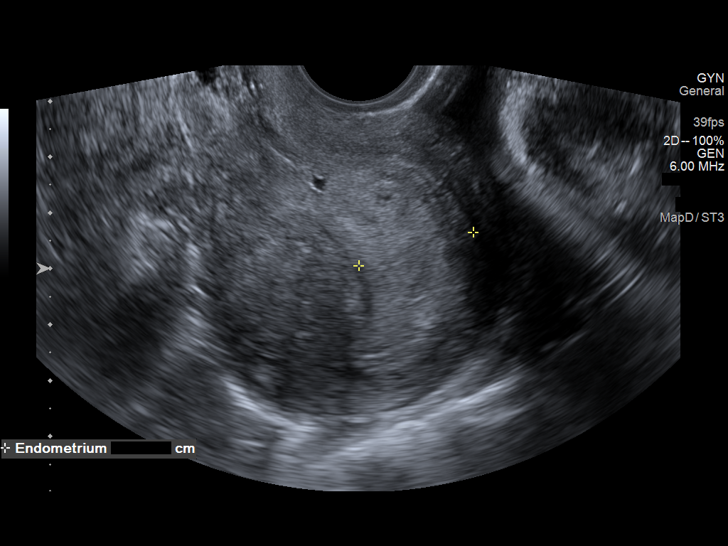
[im 82/131]
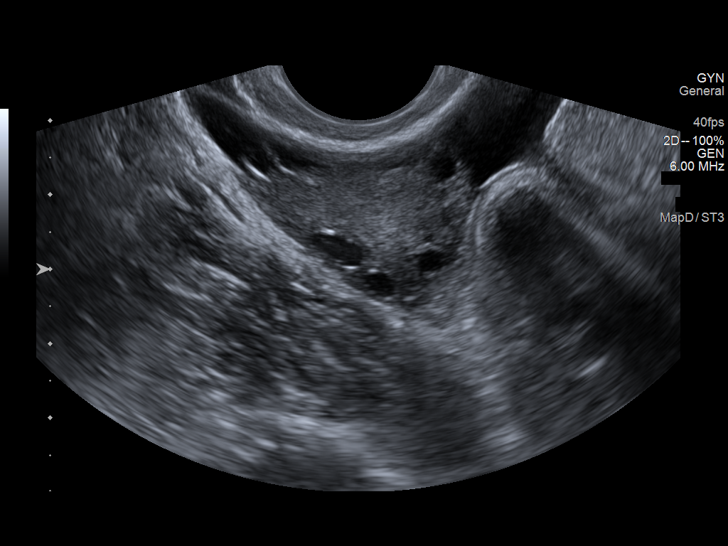
[im 92/131]
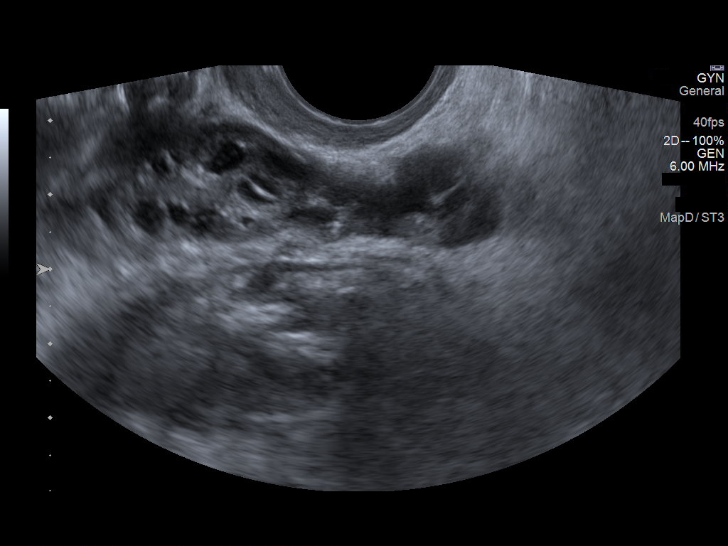
[im 102/131]
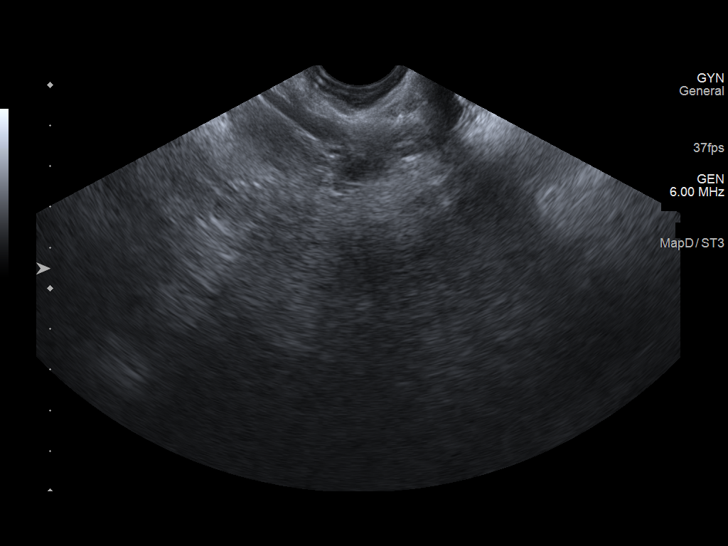
[im 111/131]
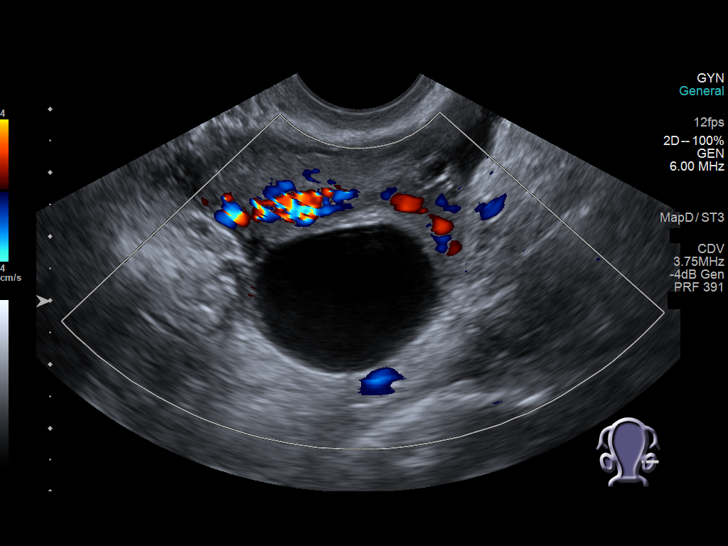
[im 121/131]
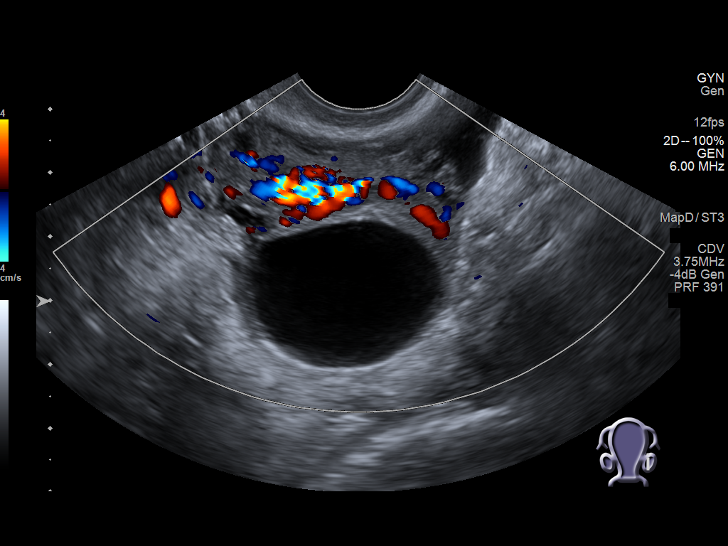
[im 131/131]
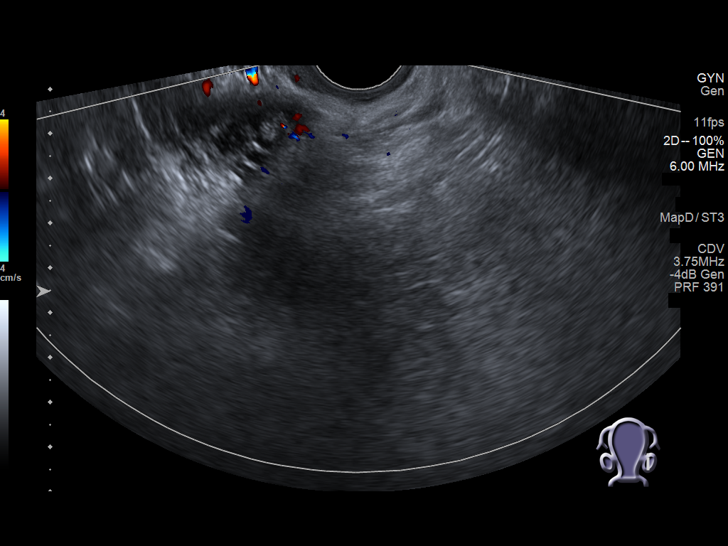

[14 of 28 positions shown; findings below may reference images not displayed]

FINDINGS: Intrauterine gestational sac: None

Yolk sac:  Not Visualized.

Embryo:  Not Visualized.

Maternal uterus/adnexae: Right ovary measures 2.9 x 4.2 x 1.6 cm and
contains multiple follicles. The left ovary measures 3.3 by 4.3 x
3.9 cm. Anechoic cyst in the left ovary measuring 3.1 cm. This
probably represents a corpus luteum. Small to moderate free fluid
within both adnexa adjacent to the ovaries.
IMPRESSION: 1. No IUP identified. Findings consistent with pregnancy of unknown
location, differential of which includes IUP too early to visualize,
occult ectopic pregnancy, and recent failed pregnancy. Trending of
HCG is advised with follow-up ultrasound as indicated
2. Small moderate bilateral adnexal fluid adjacent to both ovaries.
